# Patient Record
Sex: Female | Born: 1935 | Race: White | Hispanic: No | Marital: Married | Smoking: Never smoker
Health system: Southern US, Community
[De-identification: ages and names within clinical notes are randomized; demographics above are authoritative.]

## PROBLEM LIST (undated history)

## (undated) DIAGNOSIS — I10 Essential (primary) hypertension: Secondary | ICD-10-CM

## (undated) DIAGNOSIS — I429 Cardiomyopathy, unspecified: Secondary | ICD-10-CM

## (undated) DIAGNOSIS — M1990 Unspecified osteoarthritis, unspecified site: Secondary | ICD-10-CM

## (undated) DIAGNOSIS — I447 Left bundle-branch block, unspecified: Secondary | ICD-10-CM

## (undated) DIAGNOSIS — E538 Deficiency of other specified B group vitamins: Secondary | ICD-10-CM

## (undated) DIAGNOSIS — I509 Heart failure, unspecified: Secondary | ICD-10-CM

## (undated) HISTORY — PX: DILATION AND CURETTAGE OF UTERUS: SHX78

---

## 2010-05-31 ENCOUNTER — Ambulatory Visit: Payer: Self-pay | Admitting: Internal Medicine

## 2011-07-24 ENCOUNTER — Ambulatory Visit: Payer: Self-pay | Admitting: Internal Medicine

## 2012-07-27 ENCOUNTER — Ambulatory Visit: Payer: Self-pay

## 2013-12-07 ENCOUNTER — Ambulatory Visit: Payer: Self-pay

## 2016-04-08 ENCOUNTER — Ambulatory Visit
Admission: EM | Admit: 2016-04-08 | Discharge: 2016-04-08 | Disposition: A | Discharge status: Home / Self Care | Payer: Medicare Other | Attending: Family Medicine | Admitting: Family Medicine

## 2016-04-08 DIAGNOSIS — S39012A Strain of muscle, fascia and tendon of lower back, initial encounter: Secondary | ICD-10-CM | POA: Diagnosis not present

## 2016-04-08 HISTORY — DX: Deficiency of other specified B group vitamins: E53.8

## 2016-04-08 HISTORY — DX: Cardiomyopathy, unspecified: I42.9

## 2016-04-08 HISTORY — DX: Left bundle-branch block, unspecified: I44.7

## 2016-04-08 HISTORY — DX: Unspecified osteoarthritis, unspecified site: M19.90

## 2016-04-08 MED ORDER — HYDROCODONE-ACETAMINOPHEN 5-325 MG PO TABS: ORAL_TABLET | ORAL | 0 refills | Status: DC

## 2016-04-08 NOTE — ED Provider Notes (Signed)
[facility]    CSN: [id number] Arrival date & time: 04/08/16  1434  First Provider Contact:  None       History   Chief Complaint Chief Complaint  Patient presents with  . Back Pain    HPI Krista Phillips is a 82 y.o. female.   The history is provided by the patient.  Patient states that she was having an Echo Cardiogram on 03/20/2016. Patient states that she had to get on to a hard table and she had noticed some back then. Patient states that pain is in lower back and radiates to abdomen. Patient describes pain as worse when she has to pull up. Patient states that she has noticed that pain does improve some with tylenol. Denies chest pain, shortness of breath, dysuria, hematuria, bowel/bladder problems, numbness/tingling.   Past Medical History:  Diagnosis Date  . B12 deficiency   . Cardiomyopathy ([place])   . Degenerative arthritis   . Left bundle branch block (LBBB)     There are no active problems to display for this patient.   Past Surgical History:  Procedure Laterality Date  . DILATION AND CURETTAGE OF UTERUS      OB History    No data available       Home Medications    Prior to Admission medications   Medication Sig Start Date End Date Taking? Authorizing Provider  carvedilol (COREG) 3.125 MG tablet Take 3.125 mg by mouth 2 (two) times daily with a meal.   Yes Historical Provider, MD  ramipril (ALTACE) 2.5 MG capsule Take 2.5 mg by mouth daily.   Yes Historical Provider, MD  vitamin B-12 (CYANOCOBALAMIN) 1000 MCG tablet Take 1,000 mcg by mouth daily.   Yes Historical Provider, MD  HYDROcodone-acetaminophen (NORCO/VICODIN) 5-325 MG tablet 1 tab po bid prn 04/08/16   Norval Gable, MD    Family History Family History  Problem Relation Age of Onset  . Ovarian cancer Mother   . Ulcers Father   . Peptic Ulcer Father   . Colon cancer Sister     Social History Social History  Substance Use Topics  . Smoking status: Never Smoker  . Smokeless  tobacco: Never Used  . Alcohol use No     Allergies   Review of patient's allergies indicates no known allergies.   Review of Systems Review of Systems   Physical Exam Triage Vital Signs ED Triage Vitals  Enc Vitals Group     BP 04/08/16 1525 134/69     Pulse Rate 04/08/16 1525 75     Resp 04/08/16 1525 16     Temp 04/08/16 1525 97.8 F (36.6 C)     Temp Source 04/08/16 1525 Tympanic     SpO2 04/08/16 1525 100 %     Weight 04/08/16 1525 138 lb (62.6 kg)     Height 04/08/16 1525 5\' 2"  (1.575 m)     Head Circumference --      Peak Flow --      Pain Score 04/08/16 1524 5     Pain Loc --      Pain Edu? --      Excl. in [place]? --    No data found.   Updated Vital Signs BP 134/69 (BP Location: Left Arm)   Pulse 75   Temp 97.8 F (36.6 C) (Tympanic)   Resp 16   Ht 5\' 2"  (1.575 m)   Wt 138 lb (62.6 kg)   SpO2 100%   BMI 25.24 kg/m  Visual Acuity Right Eye Distance:   Left Eye Distance:   Bilateral Distance:    Right Eye Near:   Left Eye Near:    Bilateral Near:     Physical Exam  Constitutional: She appears well-developed and well-nourished. No distress.  Musculoskeletal: She exhibits tenderness. She exhibits no edema.       Lumbar back: She exhibits tenderness and spasm. She exhibits normal range of motion, no bony tenderness, no swelling, no edema, no deformity, no laceration, no pain and normal pulse.  Neurological: She is alert. She has normal reflexes. She displays normal reflexes. She exhibits normal muscle tone.  Skin: Skin is warm and dry. No rash noted. She is not diaphoretic. No erythema.  Nursing note and vitals reviewed.    UC Treatments / Results  Labs (all labs ordered are listed, but only abnormal results are displayed) Labs Reviewed - No data to display  EKG  EKG Interpretation None       Radiology No results found.  Procedures Procedures (including critical care time)  Medications Ordered in UC Medications - No data to  display   Initial Impression / Assessment and Plan / UC Course  I have reviewed the triage vital signs and the nursing notes.  Pertinent labs & imaging results that were available during my care of the patient were reviewed by me and considered in my medical decision making (see chart for details).  Clinical Course      Final Clinical Impressions(s) / UC Diagnoses   Final diagnoses:  Lumbar strain, initial encounter    New Prescriptions Discharge Medication List as of 04/08/2016  3:49 PM    START taking these medications   Details  HYDROcodone-acetaminophen (NORCO/VICODIN) 5-325 MG tablet 1 tab po bid prn, Print      1. diagnosis reviewed with patient 2. rx as per orders above; reviewed possible side effects, interactions, risks and benefits  3. Recommend supportive treatment with heat, stretching 4. Follow-up prn if symptoms worsen or don't improve   Norval Gable, MD 04/23/16 1201

## 2016-04-08 NOTE — ED Triage Notes (Signed)
Patient states that she was having an Echo Cardiogram on 03/20/2016. Patient states that she had to get on to a hard table and she had noticed some back then. Patient states that pain is in lower back and radiates to abdomen. Patient describes pain as worse when she has to pull up. Patient states that she has noticed that pain does improve some with tylenol.

## 2018-03-27 ENCOUNTER — Emergency Department: Payer: Medicare Other

## 2018-03-27 ENCOUNTER — Emergency Department
Admission: EM | Admit: 2018-03-27 | Discharge: 2018-03-27 | Disposition: A | Discharge status: Home / Self Care | Payer: Medicare Other | Attending: Emergency Medicine | Admitting: Emergency Medicine

## 2018-03-27 ENCOUNTER — Other Ambulatory Visit: Payer: Self-pay

## 2018-03-27 DIAGNOSIS — Z79899 Other long term (current) drug therapy: Secondary | ICD-10-CM | POA: Insufficient documentation

## 2018-03-27 DIAGNOSIS — Y92122 Bedroom in nursing home as the place of occurrence of the external cause: Secondary | ICD-10-CM | POA: Diagnosis not present

## 2018-03-27 DIAGNOSIS — S0990XA Unspecified injury of head, initial encounter: Secondary | ICD-10-CM | POA: Diagnosis present

## 2018-03-27 DIAGNOSIS — S0101XA Laceration without foreign body of scalp, initial encounter: Secondary | ICD-10-CM | POA: Diagnosis not present

## 2018-03-27 DIAGNOSIS — Z23 Encounter for immunization: Secondary | ICD-10-CM | POA: Insufficient documentation

## 2018-03-27 DIAGNOSIS — D329 Benign neoplasm of meninges, unspecified: Secondary | ICD-10-CM

## 2018-03-27 DIAGNOSIS — Y998 Other external cause status: Secondary | ICD-10-CM | POA: Insufficient documentation

## 2018-03-27 DIAGNOSIS — W06XXXA Fall from bed, initial encounter: Secondary | ICD-10-CM | POA: Diagnosis not present

## 2018-03-27 DIAGNOSIS — D320 Benign neoplasm of cerebral meninges: Secondary | ICD-10-CM | POA: Diagnosis not present

## 2018-03-27 DIAGNOSIS — W19XXXA Unspecified fall, initial encounter: Secondary | ICD-10-CM

## 2018-03-27 DIAGNOSIS — Y9389 Activity, other specified: Secondary | ICD-10-CM | POA: Insufficient documentation

## 2018-03-27 MED ORDER — LIDOCAINE HCL (PF) 1 % IJ SOLN
5.0000 mL | Freq: Once | INTRAMUSCULAR | Status: AC
  Administered 2018-03-27: 5 mL via INTRADERMAL
  Filled 2018-03-27: qty 5

## 2018-03-27 MED ORDER — TETANUS-DIPHTH-ACELL PERTUSSIS 5-2.5-18.5 LF-MCG/0.5 IM SUSP
0.5000 mL | Freq: Once | INTRAMUSCULAR | Status: AC
  Administered 2018-03-27: 0.5 mL via INTRAMUSCULAR
  Filled 2018-03-27: qty 0.5

## 2018-03-27 NOTE — ED Notes (Addendum)
.   Pt is resting, Respirations even and unlabored, NAD. Stretcher lowest postion and locked. Call bell within reach. Denies any needs at this time RN will continue to monitor.  Pt family at bedside awaiting transport back to [facility]. Family is requesting a ETA on transport. RN will inquire.

## 2018-03-27 NOTE — ED Provider Notes (Signed)
[facility] Emergency Department Provider Note  ____________________________________________   First MD Initiated Contact with Patient 03/27/18 0501     (approximate)  I have reviewed the triage vital signs and the nursing notes.   HISTORY  Chief Complaint Fall and Head Laceration   HPI Krista Phillips is a 84 y.o. female is brought to the emergency department via EMS after an unwitnessed fall at her nursing home.  She says she remembers sitting on the edge of her bed and falling forward.  She denies chest pain or palpitations.  Her last tetanus is unknown.  She does not take blood thinning medication.  She had sudden onset minor headache that is resolved with time.  She has no complaints at this point.  She denies headache chest pain shortness of breath abdominal pain nausea vomiting double vision blurred vision.  She was able to get up and ambulate.    Past Medical History:  Diagnosis Date  . B12 deficiency   . Cardiomyopathy ([place])   . Degenerative arthritis   . Left bundle branch block (LBBB)     There are no active problems to display for this patient.   Past Surgical History:  Procedure Laterality Date  . DILATION AND CURETTAGE OF UTERUS      Prior to Admission medications   Medication Sig Start Date End Date Taking? Authorizing Provider  carvedilol (COREG) 3.125 MG tablet Take 3.125 mg by mouth 2 (two) times daily with a meal.    [provider]  HYDROcodone-acetaminophen (NORCO/VICODIN) 5-325 MG tablet 1 tab po bid prn 04/08/16   Norval Gable, MD  ramipril (ALTACE) 2.5 MG capsule Take 2.5 mg by mouth daily.    [provider]  vitamin B-12 (CYANOCOBALAMIN) 1000 MCG tablet Take 1,000 mcg by mouth daily.    [provider]    Allergies Patient has no known allergies.  Family History  Problem Relation Age of Onset  . Ovarian cancer Mother   . Ulcers Father   . Peptic Ulcer Father   . Colon cancer Sister      Social History Social History   Tobacco Use  . Smoking status: Never Smoker  . Smokeless tobacco: Never Used  Substance Use Topics  . Alcohol use: No  . Drug use: No    Review of Systems Constitutional: No fever/chills Eyes: No visual changes. ENT: No sore throat. Cardiovascular: Denies chest pain. Respiratory: Denies shortness of breath. Gastrointestinal: No abdominal pain.  No nausea, no vomiting.  No diarrhea.  No constipation. Genitourinary: Negative for dysuria. Musculoskeletal: Negative for back pain. Skin: Positive for wound Neurological: Positive headache   ____________________________________________   PHYSICAL EXAM:  VITAL SIGNS: ED Triage Vitals  Enc Vitals Group     BP      Pulse      Resp      Temp      Temp src      SpO2      Weight      Height      Head Circumference      Peak Flow      Pain Score      Pain Loc      Pain Edu?      Excl. in [place]?     Constitutional: Pleasant cooperative Eyes: PERRL EOMI. midrange and brisk Head: 3 cm laceration to vertex of scalp. Nose: No congestion/rhinnorhea. Mouth/Throat: No trismus Neck: No stridor.   Cardiovascular: Normal rate, regular rhythm. Grossly normal heart sounds.  Good peripheral circulation. Respiratory: Normal respiratory effort.  No retractions. Lungs CTAB and moving good air Gastrointestinal: Soft nontender Musculoskeletal: No lower extremity edema   Neurologic:   No gross focal neurologic deficits are appreciated. Skin:  Skin is warm, dry and intact. No rash noted. Psychiatric: Somewhat demented but calm and cooperative    ____________________________________________   DIFFERENTIAL includes but not limited to  Intracerebral hemorrhage, cervical spine fracture, syncope, mechanical fall, palpitations, laceration ____________________________________________   LABS (all labs ordered are listed, but only abnormal results are displayed)  Labs Reviewed - No data to  display   __________________________________________  EKG   ____________________________________________  RADIOLOGY  CT scan of the head reviewed by me with no acute disease ____________________________________________   PROCEDURES  Procedure(s) performed: Yes  .Marland KitchenLaceration Repair Date/Time: 03/27/2018 6:13 AM Performed by: Darel Hong, MD Authorized by: Darel Hong, MD   Consent:    Consent obtained:  Verbal   Consent given by:  Patient and healthcare agent   Risks discussed:  Infection, pain, retained foreign body, poor cosmetic result and poor wound healing Anesthesia (see MAR for exact dosages):    Anesthesia method:  Local infiltration   Local anesthetic:  Lidocaine 1% w/o epi Laceration details:    Location:  Scalp   Scalp location:  Crown   Length (cm):  3 Repair type:    Repair type:  Simple Pre-procedure details:    Preparation:  Patient was prepped and draped in usual sterile fashion and imaging obtained to evaluate for foreign bodies Exploration:    Hemostasis achieved with:  Direct pressure   Wound exploration: entire depth of wound probed and visualized     Contaminated: no   Treatment:    Area cleansed with:  Saline   Amount of cleaning:  Extensive   Irrigation solution:  Sterile saline   Visualized foreign bodies/material removed: no   Skin repair:    Repair method:  Sutures and staples   Number of sutures:  3 Approximation:    Approximation:  Close Post-procedure details:    Dressing:  Sterile dressing   Patient tolerance of procedure:  Tolerated well, no immediate complications    Critical Care performed: no  ____________________________________________   INITIAL IMPRESSION / ASSESSMENT AND PLAN / ED COURSE  Pertinent labs & imaging results that were available during my care of the patient were reviewed by me and considered in my medical decision making (see chart for details).   The patient arrives with obvious trauma to  the vertex of her scalp.  Takes no blood thinning medication.  She is neuro intact and well she clearly has dementia is behaving appropriately.  Hemodynamically stable.  Tetanus will be updated and CT scan of the head pending.  ----------------------------------------- 6:15 AM on 03/27/2018 -----------------------------------------  The patient CT scan is fortunately reassuring.  She does have a meningioma and I disclosed this to the patient and family at bedside.  I anesthetized her wound with 1% lidocaine without epinephrine and then closed it with 3 staples with adequate cosmesis.  Strict return precautions have been given and the patient is discharged back home in improved condition.      ____________________________________________   FINAL CLINICAL IMPRESSION(S) / ED DIAGNOSES  Final diagnoses:  Laceration of scalp without foreign body, initial encounter  Fall, initial encounter  Meningioma ([place])      NEW MEDICATIONS STARTED DURING THIS VISIT:  New Prescriptions   No medications on file     Note:  This document was  prepared using Systems analyst and may include unintentional dictation errors.     Darel Hong, MD 03/27/18 [phone]

## 2018-03-27 NOTE — ED Triage Notes (Signed)
Pt arrives to ED via [facility] from [facility] s/p unwitnessed fall. Pt is from the Memory Care unit and unable to elaborate on details leading up to the fall. Pt denies any c/o pain at this time. Pt has an approximately 8-10 cm laceration on the top of the scalp with minimal bleeding at this time.

## 2018-03-27 NOTE — ED Notes (Signed)
Secretary notified RN that the truck is on the way. RN notified family and they agree to wait on the EMS.

## 2018-03-27 NOTE — Discharge Instructions (Signed)
Today I placed a total of 3 staples on the top of your head which need to come out in 10 to 14 days.  Any doctor can do this.  Please follow-up with your primary care physician this coming week for recheck and return to the emergency department sooner for any concerns whatsoever.  It was a pleasure to take care of you today, and thank you for coming to our emergency department.  If you have any questions or concerns before leaving please ask the nurse to grab me and I'm more than happy to go through your aftercare instructions again.  If you were prescribed any opioid pain medication today such as Norco, Vicodin, Percocet, morphine, hydrocodone, or oxycodone please make sure you do not drive when you are taking this medication as it can alter your ability to drive safely.  If you have any concerns once you are home that you are not improving or are in fact getting worse before you can make it to your follow-up appointment, please do not hesitate to call [phone] and come back for further evaluation.  Darel Hong, MD  No results found for this or any previous visit. Ct Head Wo Contrast  Result Date: 03/27/2018 CLINICAL DATA:  84 year old female with head trauma. EXAM: CT HEAD WITHOUT CONTRAST TECHNIQUE: Contiguous axial images were obtained from the base of the skull through the vertex without intravenous contrast. COMPARISON:  None. FINDINGS: Brain: Mild to moderate age-related atrophy and chronic microvascular ischemic changes. There is no acute intracranial hemorrhage. No mass effect or midline shift. No extra-axial fluid collection. There is a 5 mm dural-based lesion in the right frontal extra-axial space along the inner table of the calvarium most consistent with a meningioma. Vascular: No hyperdense vessel or unexpected calcification. Skull: Normal. Negative for fracture or focal lesion. Sinuses/Orbits: No acute finding. Other: None IMPRESSION: 1. No acute intracranial hemorrhage. 2. Age-related  atrophy and chronic microvascular ischemic changes. 3. A 5 mm right frontal meningioma. Electronically Signed   By: Anner Crete M.D.   On: 03/27/2018 06:04

## 2018-03-27 NOTE — ED Notes (Signed)
Patient transported to CT 

## 2018-03-27 NOTE — ED Notes (Signed)
Pt family is requesting to take her back to [facility] on their own. This RN will notify secretory Lissa Morales to cancel EMS.

## 2018-03-27 NOTE — ED Notes (Signed)
Pt family was notified that it would be sometime after 9 AM. Pt family agreeable to wait. RN will monitor.

## 2018-06-28 ENCOUNTER — Emergency Department: Payer: Medicare Other

## 2018-06-28 ENCOUNTER — Other Ambulatory Visit: Payer: Self-pay

## 2018-06-28 ENCOUNTER — Encounter: Payer: Self-pay | Admitting: *Deleted

## 2018-06-28 ENCOUNTER — Inpatient Hospital Stay
Admission: EM | Admit: 2018-06-28 | Discharge: 2018-06-30 | DRG: 871 | Disposition: A | Discharge status: Home Health | Payer: Medicare Other | Attending: Internal Medicine | Admitting: Internal Medicine

## 2018-06-28 DIAGNOSIS — R05 Cough: Secondary | ICD-10-CM

## 2018-06-28 DIAGNOSIS — Z66 Do not resuscitate: Secondary | ICD-10-CM | POA: Diagnosis present

## 2018-06-28 DIAGNOSIS — J208 Acute bronchitis due to other specified organisms: Secondary | ICD-10-CM | POA: Diagnosis present

## 2018-06-28 DIAGNOSIS — A419 Sepsis, unspecified organism: Secondary | ICD-10-CM | POA: Diagnosis present

## 2018-06-28 DIAGNOSIS — A4189 Other specified sepsis: Secondary | ICD-10-CM | POA: Diagnosis present

## 2018-06-28 DIAGNOSIS — I447 Left bundle-branch block, unspecified: Secondary | ICD-10-CM | POA: Diagnosis present

## 2018-06-28 DIAGNOSIS — M4854XA Collapsed vertebra, not elsewhere classified, thoracic region, initial encounter for fracture: Secondary | ICD-10-CM | POA: Diagnosis present

## 2018-06-28 DIAGNOSIS — R6510 Systemic inflammatory response syndrome (SIRS) of non-infectious origin without acute organ dysfunction: Secondary | ICD-10-CM

## 2018-06-28 DIAGNOSIS — I509 Heart failure, unspecified: Secondary | ICD-10-CM | POA: Diagnosis present

## 2018-06-28 DIAGNOSIS — E860 Dehydration: Secondary | ICD-10-CM | POA: Diagnosis present

## 2018-06-28 DIAGNOSIS — R509 Fever, unspecified: Secondary | ICD-10-CM

## 2018-06-28 DIAGNOSIS — F0390 Unspecified dementia without behavioral disturbance: Secondary | ICD-10-CM | POA: Diagnosis present

## 2018-06-28 DIAGNOSIS — I110 Hypertensive heart disease with heart failure: Secondary | ICD-10-CM | POA: Diagnosis present

## 2018-06-28 DIAGNOSIS — G9341 Metabolic encephalopathy: Secondary | ICD-10-CM | POA: Diagnosis present

## 2018-06-28 DIAGNOSIS — E538 Deficiency of other specified B group vitamins: Secondary | ICD-10-CM | POA: Diagnosis present

## 2018-06-28 DIAGNOSIS — R059 Cough, unspecified: Secondary | ICD-10-CM

## 2018-06-28 DIAGNOSIS — I428 Other cardiomyopathies: Secondary | ICD-10-CM | POA: Diagnosis present

## 2018-06-28 DIAGNOSIS — Z79899 Other long term (current) drug therapy: Secondary | ICD-10-CM | POA: Diagnosis not present

## 2018-06-28 HISTORY — DX: Heart failure, unspecified: I50.9

## 2018-06-28 HISTORY — DX: Essential (primary) hypertension: I10

## 2018-06-28 LAB — CBC WITH DIFFERENTIAL/PLATELET
ABS IMMATURE GRANULOCYTES: 0.03 10*3/uL (ref 0.00–0.07)
Basophils Absolute: 0.1 10*3/uL (ref 0.0–0.1)
Basophils Relative: 1 %
EOS ABS: 0 10*3/uL (ref 0.0–0.5)
EOS PCT: 0 %
HCT: 42.7 % (ref 36.0–46.0)
Hemoglobin: 13.6 g/dL (ref 12.0–15.0)
Immature Granulocytes: 0 %
Lymphocytes Relative: 13 %
Lymphs Abs: 1.1 10*3/uL (ref 0.7–4.0)
MCH: 29.4 pg (ref 26.0–34.0)
MCHC: 31.9 g/dL (ref 30.0–36.0)
MCV: 92.4 fL (ref 80.0–100.0)
MONO ABS: 0.7 10*3/uL (ref 0.1–1.0)
MONOS PCT: 8 %
NEUTROS ABS: 6.2 10*3/uL (ref 1.7–7.7)
Neutrophils Relative %: 78 %
Platelets: 231 10*3/uL (ref 150–400)
RBC: 4.62 MIL/uL (ref 3.87–5.11)
RDW: 14.6 % (ref 11.5–15.5)
WBC: 8 10*3/uL (ref 4.0–10.5)
nRBC: 0 % (ref 0.0–0.2)

## 2018-06-28 LAB — TROPONIN I

## 2018-06-28 LAB — COMPREHENSIVE METABOLIC PANEL
ALT: 12 U/L (ref 0–44)
ANION GAP: 11 (ref 5–15)
AST: 28 U/L (ref 15–41)
Albumin: 3.3 g/dL — ABNORMAL LOW (ref 3.5–5.0)
Alkaline Phosphatase: 137 U/L — ABNORMAL HIGH (ref 38–126)
BUN: 19 mg/dL (ref 8–23)
CHLORIDE: 100 mmol/L (ref 98–111)
CO2: 24 mmol/L (ref 22–32)
CREATININE: 1.08 mg/dL — AB (ref 0.44–1.00)
Calcium: 8.7 mg/dL — ABNORMAL LOW (ref 8.9–10.3)
GFR calc Af Amer: 51 mL/min — ABNORMAL LOW (ref >60)
GFR, EST NON AFRICAN AMERICAN: 44 mL/min — AB (ref >60)
Glucose, Bld: 215 mg/dL — ABNORMAL HIGH (ref 70–99)
POTASSIUM: 3.9 mmol/L (ref 3.5–5.1)
SODIUM: 135 mmol/L (ref 135–145)
Total Bilirubin: 0.9 mg/dL (ref 0.3–1.2)
Total Protein: 7.1 g/dL (ref 6.5–8.1)

## 2018-06-28 LAB — URINALYSIS, COMPLETE (UACMP) WITH MICROSCOPIC
BILIRUBIN URINE: NEGATIVE
Bacteria, UA: NONE SEEN
GLUCOSE, UA: 100 mg/dL — AB
HGB URINE DIPSTICK: NEGATIVE
Ketones, ur: NEGATIVE mg/dL
Leukocytes, UA: NEGATIVE
NITRITE: NEGATIVE
PROTEIN: NEGATIVE mg/dL
Specific Gravity, Urine: 1.015 (ref 1.005–1.030)
pH: 7 (ref 5.0–8.0)

## 2018-06-28 LAB — LACTIC ACID, PLASMA
Lactic Acid, Venous: 3.3 mmol/L (ref 0.5–1.9)
Lactic Acid, Venous: 1.8 mmol/L (ref 0.5–1.9)

## 2018-06-28 LAB — INFLUENZA PANEL BY PCR (TYPE A & B)
Influenza A By PCR: NEGATIVE
Influenza B By PCR: NEGATIVE

## 2018-06-28 MED ORDER — SODIUM CHLORIDE 0.9 % IV SOLN
2.0000 g | Freq: Once | INTRAVENOUS | Status: AC
  Administered 2018-06-28: 2 g via INTRAVENOUS
  Filled 2018-06-28: qty 2

## 2018-06-28 MED ORDER — VANCOMYCIN HCL IN DEXTROSE 1-5 GM/200ML-% IV SOLN
1000.0000 mg | Freq: Once | INTRAVENOUS | Status: AC
  Administered 2018-06-28: 1000 mg via INTRAVENOUS
  Filled 2018-06-28: qty 200

## 2018-06-28 MED ORDER — VANCOMYCIN HCL IN DEXTROSE 1-5 GM/200ML-% IV SOLN
1000.0000 mg | INTRAVENOUS | Status: DC
  Filled 2018-06-28: qty 200

## 2018-06-28 MED ORDER — METRONIDAZOLE IN NACL 5-0.79 MG/ML-% IV SOLN
500.0000 mg | Freq: Three times a day (TID) | INTRAVENOUS | Status: DC
  Administered 2018-06-28 – 2018-06-29 (×3): 500 mg via INTRAVENOUS
  Filled 2018-06-28 (×5): qty 100

## 2018-06-28 MED ORDER — ENOXAPARIN SODIUM 40 MG/0.4ML ~~LOC~~ SOLN: 40.0000 mg | SUBCUTANEOUS | Status: DC

## 2018-06-28 MED ORDER — ENOXAPARIN SODIUM 30 MG/0.3ML ~~LOC~~ SOLN
30.0000 mg | SUBCUTANEOUS | Status: DC
  Administered 2018-06-28: 30 mg via SUBCUTANEOUS
  Filled 2018-06-28: qty 0.3

## 2018-06-28 MED ORDER — ACETAMINOPHEN 325 MG PO TABS: 650.0000 mg | ORAL_TABLET | Freq: Four times a day (QID) | ORAL | Status: DC | PRN

## 2018-06-28 MED ORDER — DICLOFENAC SODIUM 1 % TD GEL
4.0000 g | Freq: Three times a day (TID) | TRANSDERMAL | Status: DC | PRN
  Filled 2018-06-28: qty 100

## 2018-06-28 MED ORDER — VANCOMYCIN HCL IN DEXTROSE 1-5 GM/200ML-% IV SOLN: 1000.0000 mg | Freq: Once | INTRAVENOUS | Status: DC

## 2018-06-28 MED ORDER — ONDANSETRON HCL 4 MG/2ML IJ SOLN: 4.0000 mg | Freq: Four times a day (QID) | INTRAMUSCULAR | Status: DC | PRN

## 2018-06-28 MED ORDER — ONDANSETRON HCL 4 MG PO TABS: 4.0000 mg | ORAL_TABLET | Freq: Four times a day (QID) | ORAL | Status: DC | PRN

## 2018-06-28 MED ORDER — SODIUM CHLORIDE 0.9 % IV SOLN
INTRAVENOUS | Status: DC
  Administered 2018-06-28 – 2018-06-29 (×2): via INTRAVENOUS

## 2018-06-28 MED ORDER — SODIUM CHLORIDE 0.9 % IV SOLN
Freq: Once | INTRAVENOUS | Status: AC
  Administered 2018-06-28: 17:00:00 via INTRAVENOUS

## 2018-06-28 MED ORDER — SODIUM CHLORIDE 0.9 % IV SOLN
2.0000 g | INTRAVENOUS | Status: DC
  Filled 2018-06-28: qty 2

## 2018-06-28 MED ORDER — PIPERACILLIN-TAZOBACTAM 3.375 G IVPB 30 MIN
3.3750 g | Freq: Once | INTRAVENOUS | Status: AC
  Administered 2018-06-28: 3.375 g via INTRAVENOUS
  Filled 2018-06-28: qty 50

## 2018-06-28 MED ORDER — ACETAMINOPHEN 650 MG RE SUPP: 650.0000 mg | Freq: Four times a day (QID) | RECTAL | Status: DC | PRN

## 2018-06-28 NOTE — ED Notes (Signed)
Patient taken to imaging. Dr. Jimmye Norman aware of rectal temp.

## 2018-06-28 NOTE — ED Notes (Signed)
Date and time results received: 06/28/18 1613 Test: Lactic Acid Critical Value: 3.3  Name of Provider Notified: Dr. Jimmye Norman  Orders Received? Or Actions Taken?: Orders Received - See Orders for details and Actions Taken: MD aware

## 2018-06-28 NOTE — ED Notes (Signed)
Report given to Kelly RN 

## 2018-06-28 NOTE — Progress Notes (Signed)
Family Meeting Note  Advance Directive:yes  Today a meeting took place with the Patient, patient sister and niece  Patient is unable to participate due [id number] capacity Delirious with underlying dementia   The following clinical team members were present during this meeting:MD  The following were discussed:Patient's diagnosis: Acute metabolic encephalopathy, sepsis with unclear etiology, treatment plan of care, other comorbidities dementia, cardiomyopathy, history of left bundle branch block, B12 deficiency, treatment plan of care discussed in detail with the patient's sister and niece at bedside.  They verbalized understanding of the plan   patient's progosis: Unable to determine and Goals for treatment: DNR Patient niece  Lesle Chris is the healthcare power of attorney Additional follow-up to be provided: Hospitalist  Time spent during discussion:17 min   Nicholes Mango, MD

## 2018-06-28 NOTE — H&P (Addendum)
[facility] at [place] NAME: Krista Phillips    MR#:  [id number]  DATE OF BIRTH:  03-30-1927  DATE OF ADMISSION:  06/28/2018  PRIMARY CARE PHYSICIAN: Adin Hector, MD   REQUESTING/REFERRING PHYSICIAN: Dr. Jimmye Norman  CHIEF COMPLAINT:   Generalized weakness and altered mental status HISTORY OF PRESENT ILLNESS:  Krista Phillips  is a 84 y.o. female with a known history of cardiomyopathy, dementia, degenerative arthritis, residing at [facility] was found to be more altered than her baseline and not being herself.  Sent over to the emergency department for altered mental status.  Patient was febrile hypotensive tachycardic with elevated lactic acid level at 3.3.  Blood cultures urine cultures were obtained and patient is started on broad-spectrum IV antibiotics Zosyn and vancomycin and hospitalist team is called to admit the patient.  Patient has received fluid bolus also.  Patient's sister and niece are at bedside during my examination  PAST MEDICAL HISTORY:   Past Medical History:  Diagnosis Date  . B12 deficiency   . Cardiomyopathy ([place])   . CHF (congestive heart failure) ([place])   . Degenerative arthritis   . Hypertension   . Left bundle branch block (LBBB)     PAST SURGICAL HISTOIRY:   Past Surgical History:  Procedure Laterality Date  . DILATION AND CURETTAGE OF UTERUS      SOCIAL HISTORY:   Social History   Tobacco Use  . Smoking status: Never Smoker  . Smokeless tobacco: Never Used  Substance Use Topics  . Alcohol use: No    FAMILY HISTORY:   Family History  Problem Relation Age of Onset  . Ovarian cancer Mother   . Ulcers Father   . Peptic Ulcer Father   . Colon cancer Sister     DRUG ALLERGIES:  No Known Allergies  REVIEW OF SYSTEMS:  Review of system unobtainable as the patient is encephalopathic MEDICATIONS AT HOME:   Prior to Admission medications   Medication Sig Start Date End Date Taking?  Authorizing Provider  acetaminophen (TYLENOL) 500 MG tablet Take 500 mg by mouth every 6 (six) hours as needed for mild pain, moderate pain or fever.   Yes [provider]  carvedilol (COREG) 3.125 MG tablet Take 3.125 mg by mouth 2 (two) times daily with a meal.   Yes [provider]  diclofenac sodium (VOLTAREN) 1 % GEL Apply 4 g topically 3 (three) times daily as needed (arthritis pain).   Yes [provider]  furosemide (LASIX) 40 MG tablet Take 40 mg by mouth daily.   Yes [provider]  nystatin-triamcinolone ointment (MYCOLOG) Apply 1 application topically 2 (two) times daily as needed (rash).    Yes [provider]  potassium chloride (K-DUR) 10 MEQ tablet Take 10 mEq by mouth daily.   Yes [provider]  vitamin B-12 (CYANOCOBALAMIN) 1000 MCG tablet Take 1,000 mcg by mouth daily.   Yes [provider]      VITAL SIGNS:  Blood pressure 98/74, pulse (!) 104, temperature (!) 102.3 F (39.1 C), temperature source Rectal, resp. rate 17, height '5\' 2"'  (1.575 m), weight 54.3 kg, SpO2 96 %.  PHYSICAL EXAMINATION:  GENERAL:  84 y.o.-year-old patient lying in the bed with no acute distress.  EYES: Pupils equal, round, reactive to light and accommodation. No scleral icterus. Extraocular muscles intact.  HEENT: Head atraumatic, normocephalic. Oropharynx and nasopharynx clear.  NECK:  Supple, no jugular venous distention. No thyroid  enlargement, no tenderness.  LUNGS: Normal breath sounds bilaterally, no wheezing, rales,rhonchi or crepitation. No use of accessory muscles of respiration.  CARDIOVASCULAR: S1, S2 normal. No murmurs, rubs, or gallops.  ABDOMEN: Soft, nontender, nondistended. Bowel sounds present.  EXTREMITIES: No pedal edema, cyanosis, or clubbing.  NEUROLOGIC: Lethargic but arousable not answering any questions. Gait not checked.  PSYCHIATRIC: The patient is arousable but disoriented SKIN: No obvious rash, lesion, or  ulcer.   LABORATORY PANEL:   CBC Recent Labs  Lab 06/28/18 1519  WBC 8.0  HGB 13.6  HCT 42.7  PLT 231   ------------------------------------------------------------------------------------------------------------------  Chemistries  Recent Labs  Lab 06/28/18 1519  NA 135  K 3.9  CL 100  CO2 24  GLUCOSE 215*  BUN 19  CREATININE 1.08*  CALCIUM 8.7*  AST 28  ALT 12  ALKPHOS 137*  BILITOT 0.9   ------------------------------------------------------------------------------------------------------------------  Cardiac Enzymes Recent Labs  Lab 06/28/18 1519  TROPONINI <0.03   ------------------------------------------------------------------------------------------------------------------  RADIOLOGY:  Dg Chest 2 View  Result Date: 06/28/2018 CLINICAL DATA:  Altered mental status. EXAM: CHEST - 2 VIEW COMPARISON:  None. FINDINGS: Heart size is normal. No edema or effusion to suggest failure. Left hemidiaphragm is elevated. No focal airspace disease is present. Aortic atherosclerosis is noted. Exaggerated thoracic kyphosis is present. Lower thoracic compression fractures are likely remote. IMPRESSION: 1. No acute cardiopulmonary disease. 2. Lower thoracic compression fractures with exaggerated kyphosis. 3. Aortic atherosclerosis. Electronically Signed   By: San Morelle M.D.   On: 06/28/2018 15:31   Ct Head Wo Contrast  Result Date: 06/28/2018 CLINICAL DATA:  Weakness. EXAM: CT HEAD WITHOUT CONTRAST TECHNIQUE: Contiguous axial images were obtained from the base of the skull through the vertex without intravenous contrast. COMPARISON:  Head CT 03/27/2018. FINDINGS: Brain: No evidence of acute infarction, hemorrhage, hydrocephalus, extra-axial collection or mass effect. Atrophy and chronic microvascular ischemic change are noted. Tiny meningioma over the right frontal convexities also noted. Vascular: No hyperdense vessel or unexpected calcification. Skull: No fracture  or focal lesion. Sinuses/Orbits: Scattered ethmoid air cell disease noted. Other: None. IMPRESSION: No acute abnormality. Atrophy and chronic microvascular ischemic change. Electronically Signed   By: Inge Rise M.D.   On: 06/28/2018 15:28    EKG:   Orders placed or performed during the hospital encounter of 06/28/18  . EKG 12-Lead  . EKG 12-Lead  . ED EKG  . ED EKG    IMPRESSION AND PLAN:     #Acute metabolic encephalopathy secondary to sepsis Admit to MedSurg unit Monitor patient closely and make her n.p.o. Neurochecks Pancultures and broad-spectrum IV antibiotics CT head negative Aspiration precautions  #Sepsis unclear etiology Patient met septic criteria at the time of admission with hypotension, tachycardia and elevated lactic acid with fever Blood cultures and urine cultures were obtained Urinalysis with no acute findings Chest x-ray no acute findings Lactic acid is elevated Broad-spectrum IV antibiotics cefepime, Flagyl and Vanco will be continued Aggressive hydration with IV fluids and monitor lactic acid and procalcitonin levels Respiratory virus panel and flu test ordered  #Chronic history of dementia Resume home medications once patient is more awake and alert  #B12 deficiency Currently patient is n.p.o. as the patient is altered  #Lower thoracic compression fracture Pain management as needed seem to be chronic from the falls  #Chronic history of cardiomyopathy  #Generalized weakness needs PT assessment once patient is more awake and alert  All the records are reviewed and case discussed with ED provider. Management plans discussed with the  patient, family and they are in agreement.  CODE STATUS: DO NOT RESUSCITATE  TOTAL TIME TAKING CARE OF THIS PATIENT: 43 minutes.   Note: This dictation was prepared with Dragon dictation along with smaller phrase technology. Any transcriptional errors that result from this process are unintentional.  Nicholes Mango M.D on 06/28/2018 at 6:37 PM  Between 7am to 6pm - Pager - [phone]  After 6pm go to [email or web address] - password [email or web address] [facility]  [facility]  Office  [phone]  CC: Primary care physician; Adin Hector, MD

## 2018-06-28 NOTE — ED Triage Notes (Signed)
Per EMS report, patient is a resident of [facility] and family was concerned that patient appeared weaker today. Patient saw PMD 10 days ago and has an appointment tomorrow to see her primary physician. Patient is in the memory care unit and is alert upon arrival.

## 2018-06-28 NOTE — ED Provider Notes (Signed)
[facility] Emergency Department Provider Note       Time seen: ----------------------------------------- 2:33 PM on 06/28/2018 -----------------------------------------   I have reviewed the triage vital signs and the nursing notes.  HISTORY   Chief Complaint Weakness   HPI Krista Phillips is a 84 y.o. female with a history of cardiomyopathy, left bundle branch block, degenerative arthritis, dementia who presents to the ED for altered mental status.  Patient is a resident of [place] with a reported history of dementia and is DNR however the family arrived and felt like she was not acting herself.  She has a doctor's appointment tomorrow but they felt like she need to be seen today.  She is alert upon arrival here.  Past Medical History:  Diagnosis Date  . B12 deficiency   . Cardiomyopathy ([place])   . Degenerative arthritis   . Left bundle branch block (LBBB)     There are no active problems to display for this patient.   Past Surgical History:  Procedure Laterality Date  . DILATION AND CURETTAGE OF UTERUS      Allergies Patient has no known allergies.  Social History Social History   Tobacco Use  . Smoking status: Never Smoker  . Smokeless tobacco: Never Used  Substance Use Topics  . Alcohol use: No  . Drug use: No   Review of Systems Constitutional: Negative for fever. Cardiovascular: Negative for chest pain. Respiratory: Negative for shortness of breath. Gastrointestinal: Negative for abdominal pain, vomiting and diarrhea. Musculoskeletal: Negative for back pain. Skin: Negative for rash. Neurological: Negative for headaches, focal weakness or numbness.  All systems negative/normal/unremarkable except as stated in the HPI ____________________________________________  PHYSICAL EXAM:  VITAL SIGNS: ED Triage Vitals [06/28/18 1431]  Enc Vitals Group     BP      Pulse      Resp      Temp      Temp src      SpO2      Weight  119 lb 9.6 oz (54.3 kg)     Height 5\' 2"  (1.575 m)     Head Circumference      Peak Flow      Pain Score      Pain Loc      Pain Edu?      Excl. in [place]?    Constitutional: Alert but disoriented, Well appearing and in no distress. Eyes: Conjunctivae are normal. Normal extraocular movements. Cardiovascular: Normal rate, regular rhythm. No murmurs, rubs, or gallops. Respiratory: Normal respiratory effort without tachypnea nor retractions. Breath sounds are clear and equal bilaterally. No wheezes/rales/rhonchi. Gastrointestinal: Soft and nontender. Normal bowel sounds Musculoskeletal: Nontender with normal range of motion in extremities. No lower extremity tenderness nor edema. Neurologic:  Normal speech and language.  Neurolyse weakness, nothing focal Skin:  Skin is warm, dry and intact. No rash noted. Psychiatric: Mood and affect are normal. Speech and behavior are normal.  ____________________________________________  EKG: Interpreted by me.  Sinus tachycardia with a rate of 104 bpm, PVCs, left bundle branch block, long QT  ____________________________________________  ED COURSE:  As part of my medical decision making, I reviewed the following data within the [facility] History obtained from family if available, nursing notes, old chart and ekg, as well as notes from prior ED visits. Patient presented for altered mental status and was found to be febrile on arrival, we will assess with labs and imaging as indicated at this time. Clinical Course as  of Jun 28 1728  Sun Jun 28, 2018  1633 Lactic Acid, Venous(!!): 3.3 [JW]    Clinical Course User Index [JW] Earleen Newport, MD   Procedures ____________________________________________   LABS (pertinent positives/negatives)  Labs Reviewed  COMPREHENSIVE METABOLIC PANEL - Abnormal; Notable for the following components:      Result Value   Glucose, Bld 215 (*)    Creatinine, Ser 1.08 (*)    Calcium 8.7 (*)     Albumin 3.3 (*)    Alkaline Phosphatase 137 (*)    GFR calc non Af Amer 44 (*)    GFR calc Af Amer 51 (*)    All other components within normal limits  URINALYSIS, COMPLETE (UACMP) WITH MICROSCOPIC - Abnormal; Notable for the following components:   Glucose, UA 100 (*)    All other components within normal limits  LACTIC ACID, PLASMA - Abnormal; Notable for the following components:   Lactic Acid, Venous 3.3 (*)    All other components within normal limits  CULTURE, BLOOD (ROUTINE X 2)  CULTURE, BLOOD (ROUTINE X 2)  URINE CULTURE  CBC WITH DIFFERENTIAL/PLATELET  TROPONIN I  CBG MONITORING, ED    RADIOLOGY Images were viewed by me CRITICAL CARE Performed by: Laurence Aly   Total critical care time: 30 minutes  Critical care time was exclusive of separately billable procedures and treating other patients.  Critical care was necessary to treat or prevent imminent or life-threatening deterioration.  Critical care was time spent personally by me on the following activities: development of treatment plan with patient and/or surrogate as well as nursing, discussions with consultants, evaluation of patient's response to treatment, examination of patient, obtaining history from patient or surrogate, ordering and performing treatments and interventions, ordering and review of laboratory studies, ordering and review of radiographic studies, pulse oximetry and re-evaluation of patient's condition.  CT head, chest x-ray IMPRESSION: 1. No acute cardiopulmonary disease. 2. Lower thoracic compression fractures with exaggerated kyphosis. 3. Aortic atherosclerosis. IMPRESSION: No acute abnormality.  Atrophy and chronic microvascular ischemic change. ____________________________________________  DIFFERENTIAL DIAGNOSIS   Dehydration, electrolyte abnormality, occult infection, CVA, MI  FINAL ASSESSMENT AND PLAN  Altered mental status, fever, probable sepsis   Plan: The  patient had presented for altered mental status with a history of dementia. Patient's labs were unremarkable regarding her white blood cell count but her lactic acid level was elevated. Patient's imaging was negative for any acute process.  She has had a bad cough so I presume that she has developing pneumonia and she will be treated as such.  We have given IV fluids as well for presumed sepsis.   Laurence Aly, MD   Note: This note was generated in part or whole with voice recognition software. Voice recognition is usually quite accurate but there are transcription errors that can and very often do occur. I apologize for any typographical errors that were not detected and corrected.     Earleen Newport, MD 06/28/18 [phone]

## 2018-06-28 NOTE — ED Notes (Signed)
Pt taken to floor via stretcher. VSS. NAD family with pt.

## 2018-06-28 NOTE — Progress Notes (Signed)
Pharmacy Antibiotic Note  Mazzy Santarelli is a 84 y.o. female admitted on 06/28/2018 with sepsis.  Pharmacy has been consulted for Vancomycin and Cefepime dosing.  Plan: Vancomycin 1g IV q36h, check a torugh level prior to 5th dose. Watch for changes to renal function that would necessitate recalculating dose. Cefepime 2g IV q24h  Height: 5\' 2"  (157.5 cm) Weight: 119 lb 9.6 oz (54.3 kg) IBW/kg (Calculated) : 50.1  Temp (24hrs), Avg:99.9 F (37.7 C), Min:97.4 F (36.3 C), Max:102.3 F (39.1 C)  Recent Labs  Lab 06/28/18 1519  WBC 8.0  CREATININE 1.08*  LATICACIDVEN 3.3*    Estimated Creatinine Clearance: 27.9 mL/min (A) (by C-G formula based on SCr of 1.08 mg/dL (H)).    No Known Allergies  Antimicrobials this admission: Vancomycin 10/20 >>  Cefepime 10/20 >>  Metronidazole 10/20 >>  Thank you for allowing pharmacy to be a part of this patient's care.  Paulina Fusi, PharmD, BCPS 06/28/2018 7:14 PM

## 2018-06-28 NOTE — Progress Notes (Signed)
ADMISSION NOTE:  Pt admitted to room from 141 from ED. Pt alert to self. Placed on droplet precautions. Sacral dressing applied. Niece [name] [facility]) at the bedside, navigator completed. Bed in lowest position call bell in reach bed alarm on.

## 2018-06-29 LAB — RESPIRATORY PANEL BY PCR
Adenovirus: NOT DETECTED
Bordetella pertussis: NOT DETECTED
CHLAMYDOPHILA PNEUMONIAE-RVPPCR: NOT DETECTED
CORONAVIRUS 229E-RVPPCR: NOT DETECTED
CORONAVIRUS HKU1-RVPPCR: NOT DETECTED
CORONAVIRUS OC43-RVPPCR: NOT DETECTED
Coronavirus NL63: NOT DETECTED
Influenza A: NOT DETECTED
Influenza B: NOT DETECTED
MYCOPLASMA PNEUMONIAE-RVPPCR: NOT DETECTED
Metapneumovirus: NOT DETECTED
PARAINFLUENZA VIRUS 1-RVPPCR: NOT DETECTED
Parainfluenza Virus 2: NOT DETECTED
Parainfluenza Virus 3: NOT DETECTED
Parainfluenza Virus 4: NOT DETECTED
Respiratory Syncytial Virus: DETECTED — AB
Rhinovirus / Enterovirus: NOT DETECTED

## 2018-06-29 LAB — BASIC METABOLIC PANEL
Anion gap: 8 (ref 5–15)
BUN: 19 mg/dL (ref 8–23)
CALCIUM: 8.3 mg/dL — AB (ref 8.9–10.3)
CO2: 27 mmol/L (ref 22–32)
CREATININE: 0.78 mg/dL (ref 0.44–1.00)
Chloride: 106 mmol/L (ref 98–111)
GFR calc Af Amer: >60 mL/min (ref >60)
GLUCOSE: 120 mg/dL — AB (ref 70–99)
POTASSIUM: 3.7 mmol/L (ref 3.5–5.1)
SODIUM: 141 mmol/L (ref 135–145)

## 2018-06-29 LAB — CBC
HCT: 36.8 % (ref 36.0–46.0)
Hemoglobin: 12 g/dL (ref 12.0–15.0)
MCH: 29.7 pg (ref 26.0–34.0)
MCHC: 32.6 g/dL (ref 30.0–36.0)
MCV: 91.1 fL (ref 80.0–100.0)
NRBC: 0 % (ref 0.0–0.2)
Platelets: 203 10*3/uL (ref 150–400)
RBC: 4.04 MIL/uL (ref 3.87–5.11)
RDW: 14.6 % (ref 11.5–15.5)
WBC: 6.5 10*3/uL (ref 4.0–10.5)

## 2018-06-29 LAB — PROCALCITONIN

## 2018-06-29 LAB — PROTIME-INR
INR: 1.03
PROTHROMBIN TIME: 13.4 s (ref 11.4–15.2)

## 2018-06-29 LAB — CORTISOL-AM, BLOOD: CORTISOL - AM: 14.3 ug/dL (ref 6.7–22.6)

## 2018-06-29 LAB — MRSA PCR SCREENING: MRSA by PCR: NEGATIVE

## 2018-06-29 MED ORDER — NYSTATIN-TRIAMCINOLONE 100000-0.1 UNIT/GM-% EX OINT
1.0000 "application " | TOPICAL_OINTMENT | Freq: Two times a day (BID) | CUTANEOUS | Status: DC | PRN
  Filled 2018-06-29: qty 15

## 2018-06-29 MED ORDER — INFLUENZA VAC SPLIT HIGH-DOSE 0.5 ML IM SUSY
0.5000 mL | PREFILLED_SYRINGE | INTRAMUSCULAR | Status: DC
  Filled 2018-06-29: qty 0.5

## 2018-06-29 MED ORDER — ENOXAPARIN SODIUM 40 MG/0.4ML ~~LOC~~ SOLN
40.0000 mg | SUBCUTANEOUS | Status: DC
  Administered 2018-06-29: 40 mg via SUBCUTANEOUS
  Filled 2018-06-29: qty 0.4

## 2018-06-29 MED ORDER — VITAMIN B-12 1000 MCG PO TABS
1000.0000 ug | ORAL_TABLET | Freq: Every day | ORAL | Status: DC
  Administered 2018-06-29: 1000 ug via ORAL
  Filled 2018-06-29: qty 1

## 2018-06-29 NOTE — Progress Notes (Signed)
PHARMACIST - PHYSICIAN COMMUNICATION  CONCERNING:  Enoxaparin (Lovenox) for DVT Prophylaxis    RECOMMENDATION: Patient was prescribed enoxaprin 40mg  q24 hours for VTE prophylaxis.   Filed Weights   06/28/18 1431 06/28/18 2352  Weight: 119 lb 9.6 oz (54.3 kg) 127 lb 13.9 oz (58 kg)    Body mass index is 23.39 kg/m.  Estimated Creatinine Clearance: 37.7 mL/min (by C-G formula based on SCr of 0.78 mg/dL).   Based on [place] patient is candidate for enoxaparin 40mg  every 24 hour based on weight > than 45kg for female and CrCl > 76ml/min  (Patient was originally candidate for enoxaparin 30mg  every 24 hours based on CrCl <37ml/min- 27.9 on 10/20)  DESCRIPTION: Pharmacy has adjusted enoxaparin dose per [place], approved through [place] committee.  Patient is now receiving enoxaparin 40mg  every 24 hours.     Lu Duffel, PharmD Clinical Pharmacist  06/29/2018 9:58 AM

## 2018-06-29 NOTE — Progress Notes (Signed)
[facility] at [place] NAME: Krista Phillips    MR#:  [id number]  DATE OF BIRTH:  31-Jul-1927  SUBJECTIVE:  CHIEF COMPLAINT:   Chief Complaint  Patient presents with  . Weakness   The patient has no complaints.  She is awake, alert, orientated x3.  She has no complaints but has cough and congestion during physical examination. REVIEW OF SYSTEMS:  Review of Systems  Constitutional: Negative for chills, fever and malaise/fatigue.  HENT: Negative for sore throat.   Eyes: Negative for blurred vision and double vision.  Respiratory: Positive for cough. Negative for hemoptysis, shortness of breath, wheezing and stridor.   Cardiovascular: Negative for chest pain, palpitations, orthopnea and leg swelling.  Gastrointestinal: Negative for abdominal pain, blood in stool, diarrhea, melena, nausea and vomiting.  Genitourinary: Negative for dysuria, flank pain and hematuria.  Musculoskeletal: Negative for back pain and joint pain.  Neurological: Negative for dizziness, sensory change, focal weakness, seizures, loss of consciousness, weakness and headaches.  Endo/Heme/Allergies: Negative for polydipsia.  Psychiatric/Behavioral: Negative for depression. The patient is not nervous/anxious.     DRUG ALLERGIES:  No Known Allergies VITALS:  Blood pressure (!) 97/59, pulse 79, temperature 98.1 F (36.7 C), temperature source Oral, resp. rate 15, height 5\' 2"  (1.575 m), weight 58 kg, SpO2 97 %. PHYSICAL EXAMINATION:  Physical Exam  Constitutional: She is oriented to person, place, and time. No distress.  HENT:  Head: Normocephalic.  Mouth/Throat: Oropharynx is clear and moist.  Eyes: Pupils are equal, round, and reactive to light. Conjunctivae and EOM are normal. No scleral icterus.  Neck: Normal range of motion. Neck supple. No JVD present. No tracheal deviation present.  Cardiovascular: Normal rate, regular rhythm and normal heart sounds. Exam reveals no  gallop.  No murmur heard. Pulmonary/Chest: Effort normal and breath sounds normal. No respiratory distress. She has no wheezes. She has no rales.  Abdominal: Soft. Bowel sounds are normal. She exhibits no distension. There is no tenderness. There is no rebound.  Musculoskeletal: Normal range of motion. She exhibits no edema or tenderness.  Neurological: She is alert and oriented to person, place, and time. No cranial nerve deficit.  Skin: No rash noted. No erythema.  Psychiatric:  Mildly demented.   LABORATORY PANEL:  Female CBC Recent Labs  Lab 06/29/18 0414  WBC 6.5  HGB 12.0  HCT 36.8  PLT 203   ------------------------------------------------------------------------------------------------------------------ Chemistries  Recent Labs  Lab 06/28/18 1519 06/29/18 0414  NA 135 141  K 3.9 3.7  CL 100 106  CO2 24 27  GLUCOSE 215* 120*  BUN 19 19  CREATININE 1.08* 0.78  CALCIUM 8.7* 8.3*  AST 28  --   ALT 12  --   ALKPHOS 137*  --   BILITOT 0.9  --    RADIOLOGY:  Dg Chest 2 View  Result Date: 06/28/2018 CLINICAL DATA:  Altered mental status. EXAM: CHEST - 2 VIEW COMPARISON:  None. FINDINGS: Heart size is normal. No edema or effusion to suggest failure. Left hemidiaphragm is elevated. No focal airspace disease is present. Aortic atherosclerosis is noted. Exaggerated thoracic kyphosis is present. Lower thoracic compression fractures are likely remote. IMPRESSION: 1. No acute cardiopulmonary disease. 2. Lower thoracic compression fractures with exaggerated kyphosis. 3. Aortic atherosclerosis. Electronically Signed   By: San Morelle M.D.   On: 06/28/2018 15:31   Ct Head Wo Contrast  Result Date: 06/28/2018 CLINICAL DATA:  Weakness. EXAM: CT HEAD WITHOUT CONTRAST TECHNIQUE: Contiguous axial images  were obtained from the base of the skull through the vertex without intravenous contrast. COMPARISON:  Head CT 03/27/2018. FINDINGS: Brain: No evidence of acute infarction,  hemorrhage, hydrocephalus, extra-axial collection or mass effect. Atrophy and chronic microvascular ischemic change are noted. Tiny meningioma over the right frontal convexities also noted. Vascular: No hyperdense vessel or unexpected calcification. Skull: No fracture or focal lesion. Sinuses/Orbits: Scattered ethmoid air cell disease noted. Other: None. IMPRESSION: No acute abnormality. Atrophy and chronic microvascular ischemic change. Electronically Signed   By: Inge Rise M.D.   On: 06/28/2018 15:28   ASSESSMENT AND PLAN:   #Acute metabolic encephalopathy secondary to sepsis CT head negative Aspiration precautions Improved to her baseline.  #Sepsis, possible due to acute viral bronchitis Low procalcitonin. Respiratory no show syncytial Virus. Follow-up blood cultures. Urinalysis with no acute findings Chest x-ray no acute findings Discontinue broad-spectrum IV antibiotics cefepime, Flagyl and Vanco. Hydrated with IV fluids.  Lactic acidosis.  Improved with above treatment.  #Chronic history of dementia Resumed home medications.  #B12 deficiency Continue vitamin B12 p.o.  #Lower thoracic compression fracture Pain management as needed seem to be chronic from the falls  #Chronic history of CHF and cardiomyopathy.  Unknown type, stable.  Hold Lasix and Coreg due to low blood pressure.  Hypertension.  Hold Coreg and Lasix.  #Generalized weakness. PT.  All the records are reviewed and case discussed with Care Management/Social Worker. Management plans discussed with the patient, her niece and they are in agreement.  CODE STATUS: DNR  TOTAL TIME TAKING CARE OF THIS PATIENT: 35 minutes.   More than 50% of the time was spent in counseling/coordination of care: YES  POSSIBLE D/C IN 2 DAYS, DEPENDING ON CLINICAL CONDITION.   Demetrios Loll M.D on 06/29/2018 at 1:54 PM  Between 7am to 6pm - Pager - [phone]  After 6pm go to [email or web address] - Teacher, early years/pre Hospitalists

## 2018-06-29 NOTE — Clinical Social Work Note (Signed)
Clinical Social Work Assessment  Patient Details  Name: Krista Phillips MRN: [id number] Date of Birth: 11/10/1926  Date of referral:  06/29/18               Reason for consult:  Other (Comment Required)(From [facility] )                Permission sought to share information with:  Chartered certified accountant granted to share information::  Yes, Verbal Permission Granted  Name::      [name]::     Relationship::     Contact Information:     Housing/Transportation Living arrangements for the past 2 months:  [place] of Information:  Facility, Other (Comment Required)(Niece ) Patient Interpreter Needed:  None Criminal Activity/Legal Involvement Pertinent to Current Situation/Hospitalization:  No - Comment as needed Significant Relationships:  Other Family Members(Niece ) Lives with:  Facility Resident Do you feel safe going back to the place where you live?  Yes Need for family participation in patient care:  Yes (Comment)  Care giving concerns:  Patient is a resident at [name] (fax: [phone]).    Social Worker assessment / plan:  Holiday representative (CSW) reviewed chart and noted that patient is from [facility]. CSW contacted [facility] and spoke to med Medical sales representative. Per [name] patient is on the memory care unit and walks with a walker at baseline. Per [name] patient is on room air and was suppose to get her flu shot today at the facility. [name] requested that patient get her flu shot at [facility]. CSW made RN aware of above. CSW made [facility] aware that per MD patient will likely D/C back to ALF tomorrow. Per [name] patient can return when medically stable. CSW attempted to meet with patient however she was pleasantly confused. CSW contacted patient's niece/ HPOA Krista Phillips [phone]. Krista Phillips is agreeable for patient to return to  ALF via EMS. Krista Phillips requested PT at ALF and per [name] they will get [facility] in house PT to work with patient. FL2 complete. CSW will continue to follow and assist as needed.   Employment status:  Retired Forensic scientist:  Medicare PT Recommendations:  Not assessed at this time [place] / Referral to community resources:  Other (Comment Required)(Patient will return to ALF )  Patient/Family's Response to care:  Patient's niece Krista Phillips is agreeable for patient to return to ALF.   Patient/Family's Understanding of and Emotional Response to Diagnosis, Current Treatment, and Prognosis:  Patient's niece Krista Phillips was very pleasant and thanked CSW for assistance.   Emotional Assessment Appearance:  Appears stated age Attitude/Demeanor/Rapport:  Unable to Assess Affect (typically observed):  Unable to Assess Orientation:  Oriented to Self, Fluctuating Orientation (Suspected and/or reported Sundowners), Oriented to Place Alcohol / Substance use:  Not Applicable Psych involvement (Current and /or in the community):  No (Comment)  Discharge Needs  Concerns to be addressed:  Discharge Planning Concerns Readmission within the last 30 days:  No Current discharge risk:  Cognitively Impaired Barriers to Discharge:  Continued Medical Work up   [employer], [name], LCSW 06/29/2018, 5:57 PM

## 2018-06-29 NOTE — NC FL2 (Signed)
  [place] LEVEL OF CARE SCREENING TOOL     IDENTIFICATION  Patient Name: Krista Phillips Birthdate: [birth year] Sex: female Admission Date (Current Location): 06/28/2018  [facility] and [place] Number:  Selena Lesser ([id number] Q) Facility and Address:  [facility], [address] [place], [place] [address]      Provider Number: [id number]  Attending Physician Name and Address:  Demetrios Loll, MD  Relative Name and Phone Number:       Current Level of Care: Hospital Recommended Level of Care: [place], Memory Care Prior Approval Number:    Date Approved/Denied:   PASRR Number:    Discharge Plan: Domiciliary (Rest home)(Memory Care )    Current Diagnoses: Patient Active Problem List   Diagnosis Date Noted  . Sepsis ([place]) 06/28/2018    Orientation RESPIRATION BLADDER Height & Weight     Self, Time, Place  Normal Incontinent Weight: 127 lb 13.9 oz (58 kg) Height:  5\' 2"  (157.5 cm)  BEHAVIORAL SYMPTOMS/MOOD NEUROLOGICAL BOWEL NUTRITION STATUS      Continent Diet(Diet: Heart Healthy )  AMBULATORY STATUS COMMUNICATION OF NEEDS Skin   Limited Assist Verbally Normal                       Personal Care Assistance Level of Assistance  Bathing, Feeding, Dressing Bathing Assistance: Limited assistance Feeding assistance: Independent Dressing Assistance: Limited assistance     Functional Limitations Info  Sight, Hearing, Speech Sight Info: Adequate Hearing Info: Adequate Speech Info: Adequate    SPECIAL CARE FACTORS FREQUENCY  PT (By licensed PT)     PT Frequency: (2-3 home health )              Contractures      Additional Factors Info  Code Status, Allergies Code Status Info: (DNR ) Allergies Info: (No Known Allergies. )           Current Medications (06/29/2018):  This is the current hospital active medication list Current Facility-Administered Medications  Medication Dose Route Frequency Provider  Last Rate Last Dose  . acetaminophen (TYLENOL) tablet 650 mg  650 mg Oral Q6H PRN Gouru, Aruna, MD       Or  . acetaminophen (TYLENOL) suppository 650 mg  650 mg Rectal Q6H PRN Gouru, Aruna, MD      . diclofenac sodium (VOLTAREN) 1 % transdermal gel 4 g  4 g Topical TID PRN Gouru, Aruna, MD      . enoxaparin (LOVENOX) injection 40 mg  40 mg Subcutaneous Q24H Shanlever, Charles M, RPH      . nystatin-triamcinolone ointment (MYCOLOG) 1 application  1 application Topical BID PRN Demetrios Loll, MD      . ondansetron [facility]) tablet 4 mg  4 mg Oral Q6H PRN Gouru, Aruna, MD       Or  . ondansetron (ZOFRAN) injection 4 mg  4 mg Intravenous Q6H PRN Gouru, Aruna, MD      . vitamin B-12 (CYANOCOBALAMIN) tablet 1,000 mcg  1,000 mcg Oral Daily Demetrios Loll, MD         Discharge Medications: Please see discharge summary for a list of discharge medications.  Relevant Imaging Results:  Relevant Lab Results:   Additional Information (SSN: [id number])  Shawndale Kilpatrick, [name], LCSW

## 2018-06-29 NOTE — Progress Notes (Signed)
Infection Prevention  Received call from: Raquel Sarna, RN  Unit:1A Regarding: Resp Viral Panel + RSV; does patient need precautions? IP Recommendation:  Standard precautions for adults +RSV

## 2018-06-29 NOTE — Plan of Care (Signed)

## 2018-06-30 LAB — URINE CULTURE
Culture: NO GROWTH
Special Requests: NORMAL

## 2018-06-30 MED ORDER — GUAIFENESIN 100 MG/5ML PO SOLN: 5.0000 mL | ORAL | 0 refills | Status: AC | PRN

## 2018-06-30 MED ORDER — GUAIFENESIN 100 MG/5ML PO SOLN
5.0000 mL | ORAL | Status: DC | PRN
  Filled 2018-06-30: qty 5

## 2018-06-30 NOTE — Discharge Instructions (Signed)
Aspiration and fall precaution. °

## 2018-06-30 NOTE — Progress Notes (Signed)
PT is recommending home health and patient is medically stable for D/C back to [facility] unit today. Per Sophronia Simas med tech at [facility] they will arrange for in house PT [facility] to see patient and just need a PT order. CSW placed home health orders in D/C packet and put home health PT on the FL2. Patient's sister [name] will transport patient. RN will call report. CSW sent D/C summary and FL2 to [facility]. Patient's niece [name] is aware of above. Please reconsult if future social work needs arise. CSW signing off.   [employer], LCSW [phone]

## 2018-06-30 NOTE — Progress Notes (Signed)
Pt wheeled to car by staff, discharge packet given to niece who was notified to give to facility.

## 2018-06-30 NOTE — NC FL2 (Signed)
  [place] LEVEL OF CARE SCREENING TOOL     IDENTIFICATION  Patient Name: Krista Phillips Birthdate: 1926-11-18 Sex: female Admission Date (Current Location): 06/28/2018  [facility] and [place] Number:  Selena Lesser ([id number] [phone]) Facility and Address:  [facility], [address] [place], [place] [address]      Provider Number: [id number]  Attending Physician Name and Address:  Demetrios Loll, MD  Relative Name and Phone Number:       Current Level of Care: Hospital Recommended Level of Care: [place], Memory Care Prior Approval Number:    Date Approved/Denied:   PASRR Number:    Discharge Plan: Domiciliary (Rest home)(Memory Care )    Current Diagnoses: Patient Active Problem List   Diagnosis Date Noted  . Sepsis ([place]) 06/28/2018    Orientation RESPIRATION BLADDER Height & Weight     Self, Time, Place  Normal Incontinent Weight: 127 lb 13.9 oz (58 kg) Height:  5\' 2"  (157.5 cm)  BEHAVIORAL SYMPTOMS/MOOD NEUROLOGICAL BOWEL NUTRITION STATUS      Continent Diet(Diet: Heart Healthy )  AMBULATORY STATUS COMMUNICATION OF NEEDS Skin   Limited Assist Verbally Normal                       Personal Care Assistance Level of Assistance  Bathing, Feeding, Dressing Bathing Assistance: Limited assistance Feeding assistance: Independent Dressing Assistance: Limited assistance     Functional Limitations Info  Sight, Hearing, Speech Sight Info: Adequate Hearing Info: Adequate Speech Info: Adequate    SPECIAL CARE FACTORS FREQUENCY  PT (By licensed PT)     PT Frequency: (2-3 home health )              Contractures      Additional Factors Info  Code Status, Allergies Code Status Info: (DNR ) Allergies Info: (No Known Allergies. )          Discharge Medications: Please see discharge summary for a list of discharge medications. Medication List    STOP taking these medications   furosemide 40 MG  tablet Commonly known as:  LASIX     TAKE these medications   acetaminophen 500 MG tablet Commonly known as:  TYLENOL Take 500 mg by mouth every 6 (six) hours as needed for mild pain, moderate pain or fever.   carvedilol 3.125 MG tablet Commonly known as:  COREG Take 3.125 mg by mouth 2 (two) times daily with a meal.   diclofenac sodium 1 % Gel Commonly known as:  VOLTAREN Apply 4 g topically 3 (three) times daily as needed (arthritis pain).   guaiFENesin 100 MG/5ML Soln Commonly known as:  ROBITUSSIN Take 5 mLs (100 mg total) by mouth every 4 (four) hours as needed for cough or to loosen phlegm.   nystatin-triamcinolone ointment Commonly known as:  MYCOLOG Apply 1 application topically 2 (two) times daily as needed (rash).   potassium chloride 10 MEQ tablet Commonly known as:  K-DUR Take 10 mEq by mouth daily.   vitamin B-12 1000 MCG tablet Commonly known as:  CYANOCOBALAMIN Take 1,000 mcg by mouth daily.  Relevant Imaging Results: Relevant Lab Results: Additional Information (SSN: [id number])  Mukesh Kornegay, [name], LCSW

## 2018-06-30 NOTE — Evaluation (Signed)
Physical Therapy Evaluation Patient Details Name: Krista Phillips MRN: [id number] DOB: 1927/08/14 Today's Date: 06/30/2018   History of Present Illness  Pt is a 84 y.o female presenting with and increased in altered mental status compared to her baseline as well as general increased weakness. Pt is a long term resident of [facility]. Pt has had a fall most recently in July. PMH significant for cardiomyopathy, left bundle branch block, degenerative arthritis, and dementia.    Clinical Impression  Pt pleasant upon arrival oriented to all but time (typical at baseline according to niece at bedside). Pt and family reporting an increase in weakness and a decline in activity over the most recent weeks. Pt performing bed mobility slow but with no physical assist. Pt typically ambulates to the dining hall at [facility] and currently is able to ambulate 140 feet before getting fatigued and needing to sit. Immediate limp causing step to gait however with further ambulation pt having only mild RLE limp and step through gait. Pt with no reported pain in LE's. Pt is limited in mobility, endurance, and overall strength and would benefit from skilled PT via HHPT at [facility] to improve upon these functional limitations.     Follow Up Recommendations Home health PT    Equipment Recommendations       Recommendations for Other Services       Precautions / Restrictions Precautions Precautions: Fall Restrictions Weight Bearing Restrictions: No      Mobility  Bed Mobility Overal bed mobility: Needs Assistance Bed Mobility: Supine to Sit     Supine to sit: Supervision     General bed mobility comments: Pt with good effort very slow to get into sitting however ultimately needing no physical assist to perform transfer. VC to reach for arm rail of bed to assist in raising trunk into sitting.   Transfers Overall transfer level: Needs assistance Equipment used: Rolling walker (2  wheeled) Transfers: Sit to/from Stand Sit to Stand: Min guard         General transfer comment: CGA for safety, pt rocking 3 times before standing but able to stand without physical assist. Upon standing pt holding RW incorrectly but able to correct after VC. Slight tipping of RW posteriorly upon standing.   Ambulation/Gait Ambulation/Gait assistance: Min guard Gait Distance (Feet): 140 Feet Assistive device: Rolling walker (2 wheeled)       General Gait Details: Pt ambulating slow with moderate RLE limp and step to gait initially however no pain reported. As ambulation progressed out of room pt performing step through with only mild RLE limp, good cadence, age appropriate gait speed, no physical assist or LOB noted.   Stairs            Wheelchair Mobility    Modified Rankin (Stroke Patients Only)       Balance Overall balance assessment: Mild deficits observed, not formally tested                                           Pertinent Vitals/Pain Pain Assessment: No/denies pain    Home Living Family/patient expects to be discharged to:: Skilled nursing facility                 Additional Comments: Long term resident of [facility]    Prior Function Level of Independence: Needs assistance   Gait / Transfers Assistance Needed: Able  to ambulate to dining hall with RW.   ADL's / Homemaking Assistance Needed: Pt needs assistance with bathing, dressing, and toileting.         Hand Dominance        Extremity/Trunk Assessment   Upper Extremity Assessment Upper Extremity Assessment: Generalized weakness(age appropriate deficits noted grossly >3/5)    Lower Extremity Assessment Lower Extremity Assessment: Generalized weakness(age appropriate deficits noted grossly >3/5)       Communication   Communication: No difficulties  Cognition Arousal/Alertness: Awake/alert Behavior During Therapy: WFL for tasks assessed/performed Overall  Cognitive Status: History of cognitive impairments - at baseline                                 General Comments: Niece at bed side reporting pt has cognitive impairements at baseline due to dementia however recently she has been less responsive.       General Comments General comments (skin integrity, edema, etc.): SpO2 in high 90's t/o, HR in high 90's at rest reaching as high at 109 with ambulation    Exercises     Assessment/Plan    PT Assessment Patient needs continued PT services  PT Problem List Decreased strength;Decreased activity tolerance;Decreased balance;Decreased mobility;Decreased safety awareness;Decreased knowledge of use of DME       PT Treatment Interventions DME instruction;Balance training;Modalities;Gait training;Functional mobility training;Patient/family education;Therapeutic activities;Therapeutic exercise    PT Goals (Current goals can be found in the Care Plan section)  Acute Rehab PT Goals Patient Stated Goal: to improve overall strength  PT Goal Formulation: With patient/family Time For Goal Achievement: 07/14/18 Potential to Achieve Goals: Good    Frequency Min 2X/week   Barriers to discharge        Co-evaluation               AM-PAC PT "6 Clicks" Daily Activity  Outcome Measure Difficulty turning over in bed (including adjusting bedclothes, sheets and blankets)?: A Little Difficulty moving from lying on back to sitting on the side of the bed? : A Little Difficulty sitting down on and standing up from a chair with arms (e.g., wheelchair, bedside commode, etc,.)?: A Little Help needed moving to and from a bed to chair (including a wheelchair)?: A Little Help needed walking in hospital room?: A Little Help needed climbing 3-5 steps with a railing? : A Lot 6 Click Score: 17    End of Session Equipment Utilized During Treatment: Gait belt Activity Tolerance: Patient tolerated treatment well Patient left: in chair;with  call bell/phone within reach;with chair alarm set;with family/visitor present Nurse Communication: Mobility status PT Visit Diagnosis: Other abnormalities of gait and mobility (R26.89);Difficulty in walking, not elsewhere classified (R26.2);Muscle weakness (generalized) (M62.81);History of falling (Z91.81)    Time: 9678-9381 PT Time Calculation (min) (ACUTE ONLY): 35 min   Charges:              Ernie Avena, SPT 06/30/2018, 10:23 AM

## 2018-06-30 NOTE — Discharge Summary (Signed)
[facility] NAME: Krista Phillips    MR#:  [id number]  DATE OF BIRTH:  [birth year]  DATE OF ADMISSION:  06/28/2018   ADMITTING PHYSICIAN: Nicholes Mango, MD  DATE OF DISCHARGE: 06/30/2018 PRIMARY CARE PHYSICIAN: Tama High III, MD   ADMISSION DIAGNOSIS:  Cough [R05] SIRS (systemic inflammatory response syndrome) (HCC) [R65.10] Fever, unspecified fever cause [R50.9] DISCHARGE DIAGNOSIS:  Active Problems:   Sepsis ([place])  SECONDARY DIAGNOSIS:   Past Medical History:  Diagnosis Date  . B12 deficiency   . Cardiomyopathy ([place])   . CHF (congestive heart failure) ([place])   . Degenerative arthritis   . Hypertension   . Left bundle branch block (LBBB)    HOSPITAL COURSE:  #Acute metabolic encephalopathy secondary to sepsis CT head negative Aspiration precautions Improved to her baseline.  #Sepsis, possible due to acute viral bronchitis Low procalcitonin. Respiratory show syncytial Virus. Negative blood cultures so far. Urinalysis with no acute findings Chest x-ray no acute findings Discontinued broad-spectrum IV antibiotics cefepime, Flagyl and Vanco. Hydrated with IV fluids.  Lactic acidosis.  Improved with above treatment.  #Chronic history of dementia Resumed home medications.  #B12 deficiency Continue vitamin B12 p.o.  #Lower thoracic compression fracture Pain management as needed seem to be chronic from the falls  #Chronic history of CHF and cardiomyopathy.  Unknown type, stable.  Hold Lasix and Coreg due to low blood pressure.  Hypertension.  resume Coreg and hold Lasix. Follow up PCP to adjust.  #Generalized weakness. HHPT.  DISCHARGE CONDITIONS:  Stable, discharge to ALF with HHPT CONSULTS OBTAINED:   DRUG ALLERGIES:  No Known Allergies DISCHARGE MEDICATIONS:   Allergies as of 06/30/2018   No Known Allergies     Medication List    STOP taking these medications   furosemide 40 MG  tablet Commonly known as:  LASIX     TAKE these medications   acetaminophen 500 MG tablet Commonly known as:  TYLENOL Take 500 mg by mouth every 6 (six) hours as needed for mild pain, moderate pain or fever.   carvedilol 3.125 MG tablet Commonly known as:  COREG Take 3.125 mg by mouth 2 (two) times daily with a meal.   diclofenac sodium 1 % Gel Commonly known as:  VOLTAREN Apply 4 g topically 3 (three) times daily as needed (arthritis pain).   guaiFENesin 100 MG/5ML Soln Commonly known as:  ROBITUSSIN Take 5 mLs (100 mg total) by mouth every 4 (four) hours as needed for cough or to loosen phlegm.   nystatin-triamcinolone ointment Commonly known as:  MYCOLOG Apply 1 application topically 2 (two) times daily as needed (rash).   potassium chloride 10 MEQ tablet Commonly known as:  K-DUR Take 10 mEq by mouth daily.   vitamin B-12 1000 MCG tablet Commonly known as:  CYANOCOBALAMIN Take 1,000 mcg by mouth daily.        DISCHARGE INSTRUCTIONS:  See AVS. If you experience worsening of your admission symptoms, develop shortness of breath, life threatening emergency, suicidal or homicidal thoughts you must seek medical attention immediately by calling 911 or calling your MD immediately  if symptoms less severe.  You Must read complete instructions/literature along with all the possible adverse reactions/side effects for all the Medicines you take and that have been prescribed to you. Take any new Medicines after you have completely understood and accpet all the possible adverse reactions/side effects.   Please note  You were cared for by a hospitalist during  your hospital stay. If you have any questions about your discharge medications or the care you received while you were in the hospital after you are discharged, you can call the unit and asked to speak with the hospitalist on call if the hospitalist that took care of you is not available. Once you are discharged, your primary  care physician will handle any further medical issues. Please note that NO REFILLS for any discharge medications will be authorized once you are discharged, as it is imperative that you return to your primary care physician (or establish a relationship with a primary care physician if you do not have one) for your aftercare needs so that they can reassess your need for medications and monitor your lab values.    On the day of Discharge:  VITAL SIGNS:  Blood pressure 138/65, pulse 73, temperature 97.9 F (36.6 C), temperature source Oral, resp. rate 18, height 5\' 2"  (1.575 m), weight 58 kg, SpO2 97 %. PHYSICAL EXAMINATION:  GENERAL:  84 y.o.-year-old patient lying in the bed with no acute distress.  EYES: Pupils equal, round, reactive to light and accommodation. No scleral icterus. Extraocular muscles intact.  HEENT: Head atraumatic, normocephalic. Oropharynx and nasopharynx clear.  NECK:  Supple, no jugular venous distention. No thyroid enlargement, no tenderness.  LUNGS: Normal breath sounds bilaterally, no wheezing, rales,rhonchi or crepitation. No use of accessory muscles of respiration.  CARDIOVASCULAR: S1, S2 normal. No murmurs, rubs, or gallops.  ABDOMEN: Soft, non-tender, non-distended. Bowel sounds present. No organomegaly or mass.  EXTREMITIES: No pedal edema, cyanosis, or clubbing.  NEUROLOGIC: Cranial nerves II through XII are intact. Muscle strength/ 5 in all extremities. Sensation intact. Gait not checked.  PSYCHIATRIC: The patient is alert and oriented x 3.  SKIN: No obvious rash, lesion, or ulcer.  DATA REVIEW:   CBC Recent Labs  Lab 06/29/18 0414  WBC 6.5  HGB 12.0  HCT 36.8  PLT 203    Chemistries  Recent Labs  Lab 06/28/18 1519 06/29/18 0414  NA 135 141  K 3.9 3.7  CL 100 106  CO2 24 27  GLUCOSE 215* 120*  BUN 19 19  CREATININE 1.08* 0.78  CALCIUM 8.7* 8.3*  AST 28  --   ALT 12  --   ALKPHOS 137*  --   BILITOT 0.9  --      Microbiology Results   Results for orders placed or performed during the hospital encounter of 06/28/18  Blood culture (routine x 2)     Status: None (Preliminary result)   Collection Time: 06/28/18  3:19 PM  Result Value Ref Range Status   Specimen Description BLOOD LEFT ANTECUBITAL  Final   Special Requests   Final    BOTTLES DRAWN AEROBIC AND ANAEROBIC Blood Culture adequate volume   Culture   Final    NO GROWTH 2 DAYS Performed at [facility], [address] [place], [place] [address]    Report Status PENDING  Incomplete  Blood culture (routine x 2)     Status: None (Preliminary result)   Collection Time: 06/28/18  3:19 PM  Result Value Ref Range Status   Specimen Description BLOOD RIGHT ANTECUBITAL  Final   Special Requests   Final    BOTTLES DRAWN AEROBIC AND ANAEROBIC Blood Culture adequate volume   Culture   Final    NO GROWTH 2 DAYS Performed at [facility], [address] [place], [place] [address]    Report Status PENDING  Incomplete  Urine  Culture     Status: None   Collection Time: 06/28/18  4:13 PM  Result Value Ref Range Status   Specimen Description   Final    URINE, CATHETERIZED Performed at [facility], [address], [place], [place] [address]    Special Requests   Final    Normal Performed at [facility], [address] [place], [place] [address]    Culture   Final    NO GROWTH Performed at [facility], [place] [address] [place], [place] [address]    Report Status 06/30/2018 FINAL  Final  Respiratory Panel by PCR     Status: Abnormal   Collection Time: 06/28/18  7:48 PM  Result Value Ref Range Status   Adenovirus NOT DETECTED NOT DETECTED Final   Coronavirus 229E NOT DETECTED NOT DETECTED Final   Coronavirus HKU1 NOT DETECTED NOT DETECTED Final   Coronavirus NL63 NOT DETECTED NOT DETECTED Final   Coronavirus OC43 NOT DETECTED NOT DETECTED Final   Metapneumovirus NOT DETECTED NOT DETECTED Final   Rhinovirus /  Enterovirus NOT DETECTED NOT DETECTED Final   Influenza A NOT DETECTED NOT DETECTED Final   Influenza B NOT DETECTED NOT DETECTED Final   Parainfluenza Virus 1 NOT DETECTED NOT DETECTED Final   Parainfluenza Virus 2 NOT DETECTED NOT DETECTED Final   Parainfluenza Virus 3 NOT DETECTED NOT DETECTED Final   Parainfluenza Virus 4 NOT DETECTED NOT DETECTED Final   Respiratory Syncytial Virus DETECTED (A) NOT DETECTED Final    Comment: CRITICAL RESULT CALLED TO, READ BACK BY AND VERIFIED WITH: Henderson Newcomer RN 10:25 06/29/18 ([email or web address])    Bordetella pertussis NOT DETECTED NOT DETECTED Final   Chlamydophila pneumoniae NOT DETECTED NOT DETECTED Final   Mycoplasma pneumoniae NOT DETECTED NOT DETECTED Final    Comment: Performed at [facility], [place] [address] [place], [place] [address]  MRSA PCR Screening     Status: None   Collection Time: 06/28/18  9:28 PM  Result Value Ref Range Status   MRSA by PCR NEGATIVE NEGATIVE Final    Comment:        The GeneXpert MRSA Assay (FDA approved for NASAL specimens only), is one component of a comprehensive MRSA colonization surveillance program. It is not intended to diagnose MRSA infection nor to guide or monitor treatment for MRSA infections. Performed at [facility], [address] [place], [place] [address]     RADIOLOGY:  No results found.   Management plans discussed with the patient, family and they are in agreement.  CODE STATUS: DNR   TOTAL TIME TAKING CARE OF THIS PATIENT:  32 minutes.    Demetrios Loll M.D on 06/30/2018 at 10:21 AM  Between 7am to 6pm - Pager - [phone]  After 6pm go to [email or web address] - password [email or web address] [facility]  [facility]  Office  [phone]  CC: Primary care physician; Adin Hector, MD   Note: This dictation was prepared with Dragon dictation along with smaller phrase technology. Any transcriptional errors that result from this process are unintentional.

## 2018-06-30 NOTE — Progress Notes (Signed)
Attempted to call report to [place] at [facility], nurse unavailable to take report.

## 2018-07-02 ENCOUNTER — Telehealth: Payer: Self-pay

## 2018-07-02 NOTE — Telephone Encounter (Signed)
EMMI Follow-up: Mrs. Krista Phillips called as she had received an automated call from my number and said she thought it would be regarding her sister, Krista Phillips.  I explained our process of two automated calls post discharge and she said her sister returned to [facility] and was sure the facility was aware of Rx's and follow-up appointments.  I explained these follow-up calls usually go to patients returning home and possibly the wrong discharge code may have been entered and asked if she would like to removed from the follow-up calls and she said no as she didn't mind getting them. No needs noted.

## 2018-07-03 LAB — CULTURE, BLOOD (ROUTINE X 2)
Culture: NO GROWTH
Culture: NO GROWTH
SPECIAL REQUESTS: ADEQUATE
Special Requests: ADEQUATE

## 2018-08-12 ENCOUNTER — Emergency Department: Payer: Medicare Other

## 2018-08-12 ENCOUNTER — Other Ambulatory Visit: Payer: Self-pay

## 2018-08-12 ENCOUNTER — Emergency Department
Admission: EM | Admit: 2018-08-12 | Discharge: 2018-08-12 | Disposition: A | Discharge status: Skilled Nursing Facility | Payer: Medicare Other | Attending: Student in an Organized Health Care Education/Training Program | Admitting: Student in an Organized Health Care Education/Training Program

## 2018-08-12 ENCOUNTER — Encounter: Payer: Self-pay | Admitting: *Deleted

## 2018-08-12 DIAGNOSIS — I110 Hypertensive heart disease with heart failure: Secondary | ICD-10-CM | POA: Insufficient documentation

## 2018-08-12 DIAGNOSIS — W19XXXA Unspecified fall, initial encounter: Secondary | ICD-10-CM

## 2018-08-12 DIAGNOSIS — W182XXA Fall in (into) shower or empty bathtub, initial encounter: Secondary | ICD-10-CM | POA: Diagnosis not present

## 2018-08-12 DIAGNOSIS — R2681 Unsteadiness on feet: Secondary | ICD-10-CM | POA: Insufficient documentation

## 2018-08-12 DIAGNOSIS — Z9181 History of falling: Secondary | ICD-10-CM | POA: Diagnosis not present

## 2018-08-12 DIAGNOSIS — Y939 Activity, unspecified: Secondary | ICD-10-CM | POA: Insufficient documentation

## 2018-08-12 DIAGNOSIS — Y929 Unspecified place or not applicable: Secondary | ICD-10-CM | POA: Insufficient documentation

## 2018-08-12 DIAGNOSIS — S42222A 2-part displaced fracture of surgical neck of left humerus, initial encounter for closed fracture: Secondary | ICD-10-CM

## 2018-08-12 DIAGNOSIS — Y999 Unspecified external cause status: Secondary | ICD-10-CM | POA: Diagnosis not present

## 2018-08-12 DIAGNOSIS — I509 Heart failure, unspecified: Secondary | ICD-10-CM | POA: Diagnosis not present

## 2018-08-12 DIAGNOSIS — S4992XA Unspecified injury of left shoulder and upper arm, initial encounter: Secondary | ICD-10-CM | POA: Diagnosis present

## 2018-08-12 DIAGNOSIS — M6281 Muscle weakness (generalized): Secondary | ICD-10-CM | POA: Diagnosis not present

## 2018-08-12 DIAGNOSIS — Z79899 Other long term (current) drug therapy: Secondary | ICD-10-CM | POA: Diagnosis not present

## 2018-08-12 MED ORDER — TETANUS-DIPHTH-ACELL PERTUSSIS 5-2.5-18.5 LF-MCG/0.5 IM SUSP
0.5000 mL | Freq: Once | INTRAMUSCULAR | Status: DC
  Filled 2018-08-12: qty 0.5

## 2018-08-12 MED ORDER — ACETAMINOPHEN 500 MG PO TABS
1000.0000 mg | ORAL_TABLET | Freq: Once | ORAL | Status: AC
  Administered 2018-08-12: 1000 mg via ORAL
  Filled 2018-08-12: qty 2

## 2018-08-12 MED ORDER — HYDROCODONE-ACETAMINOPHEN 7.5-325 MG/15ML PO SOLN: 5.0000 mL | Freq: Four times a day (QID) | ORAL | 0 refills | Status: DC | PRN

## 2018-08-12 MED ORDER — LIDOCAINE 5 % EX PTCH
1.0000 | MEDICATED_PATCH | CUTANEOUS | Status: DC
  Administered 2018-08-12: 1 via TRANSDERMAL
  Filled 2018-08-12: qty 1

## 2018-08-12 MED ORDER — LIDOCAINE 5 % EX PTCH: 1.0000 | MEDICATED_PATCH | Freq: Two times a day (BID) | CUTANEOUS | 0 refills | Status: DC

## 2018-08-12 NOTE — ED Provider Notes (Signed)
[facility] Emergency Department Provider Note    First MD Initiated Contact with Patient 08/12/18 [phone]     (approximate)  I have reviewed the triage vital signs and the nursing notes.   HISTORY  Chief Complaint Fall    HPI Krista Phillips is a 84 y.o. female presents the ER for left elbow pain after unwitnessed fall that occurred while she was in the bathroom.  Patient states that she lost her footing.  Denies hitting her head.  There is no LOC.  Denies any headache.  Denies any pain.  No hip pain no abdominal pain no chest pain.  No shortness of breath.  Denies any palpitations.  No numbness or tingling.  She arrives with a small superficial skin tear to the left lateral elbow that is currently bandaged.    Past Medical History:  Diagnosis Date  . B12 deficiency   . Cardiomyopathy ([place])   . CHF (congestive heart failure) ([place])   . Degenerative arthritis   . Hypertension   . Left bundle branch block (LBBB)    Family History  Problem Relation Age of Onset  . Ovarian cancer Mother   . Ulcers Father   . Peptic Ulcer Father   . Colon cancer Sister    Past Surgical History:  Procedure Laterality Date  . DILATION AND CURETTAGE OF UTERUS     Patient Active Problem List   Diagnosis Date Noted  . Sepsis ([place]) 06/28/2018      Prior to Admission medications   Medication Sig Start Date End Date Taking? Authorizing Provider  acetaminophen (TYLENOL) 500 MG tablet Take 500 mg by mouth every 6 (six) hours as needed for mild pain, moderate pain or fever.   Yes [provider]  carvedilol (COREG) 3.125 MG tablet Take 3.125 mg by mouth 2 (two) times daily with a meal.   Yes [provider]  diclofenac sodium (VOLTAREN) 1 % GEL Apply 4 g topically 3 (three) times daily as needed (arthritis pain).   Yes [provider]  guaiFENesin (ROBITUSSIN) 100 MG/5ML SOLN Take 5 mLs (100 mg total) by mouth every 4 (four) hours as needed for cough  or to loosen phlegm. 06/30/18  Yes Demetrios Loll, MD  nystatin-triamcinolone ointment [facility]) Apply 1 application topically 2 (two) times daily as needed (rash).    Yes [provider]  vitamin B-12 (CYANOCOBALAMIN) 1000 MCG tablet Take 1,000 mcg by mouth daily.   Yes [provider]  HYDROcodone-acetaminophen (HYCET) 7.5-325 mg/15 ml solution Take 5 mLs by mouth every 6 (six) hours as needed for severe pain. 08/12/18 08/12/19  Merlyn Lot, MD  lidocaine (LIDODERM) 5 % Place 1 patch onto the skin every 12 (twelve) hours. Remove & Discard patch within 12 hours or as directed by MD 08/12/18 08/12/19  Merlyn Lot, MD    Allergies Patient has no known allergies.    Social History Social History   Tobacco Use  . Smoking status: Never Smoker  . Smokeless tobacco: Never Used  Substance Use Topics  . Alcohol use: No  . Drug use: No    Review of Systems Patient denies headaches, rhinorrhea, blurry vision, numbness, shortness of breath, chest pain, edema, cough, abdominal pain, nausea, vomiting, diarrhea, dysuria, fevers, rashes or hallucinations unless otherwise stated above in HPI. ____________________________________________   PHYSICAL EXAM:  VITAL SIGNS: Vitals:   08/12/18 0930 08/12/18 0945  BP:    Pulse: (!) 103 100  Resp:    Temp:  SpO2: 97% 93%    Constitutional: Alert, pleasant and in no acute distress  Eyes: Conjunctivae are normal.  Head: Atraumatic. Nose: No congestion/rhinnorhea. Mouth/Throat: Mucous membranes are moist.   Neck: No stridor. Painless ROM.  Cardiovascular: Normal rate, regular rhythm. Grossly normal heart sounds.  Good peripheral circulation. Respiratory: Normal respiratory effort.  No retractions. Lungs CTAB. Gastrointestinal: Soft and nontender. No distention. No abdominal bruits. No CVA tenderness. Genitourinary:deferred Musculoskeletal:  Ecchymosis and superficial skin tear 2cm to left lateral elbow.  Ecchymosis to  distal forearm without deformity. No lower extremity tenderness nor edema.  No joint effusions. Neurologic:  Normal speech and language. No gross focal neurologic deficits are appreciated. No facial droop Skin:  Skin is warm, dry and intact. No rash noted. Psychiatric: Mood and affect are normal. Speech and behavior are normal.  ____________________________________________   LABS (all labs ordered are listed, but only abnormal results are displayed)  No results found for this or any previous visit (from the past 24 hour(s)). ____________________________________________ ____________________________________________  [id number]  I personally reviewed all radiographic images ordered to evaluate for the above acute complaints and reviewed radiology reports and findings.  These findings were personally discussed with the patient.  Please see medical record for radiology report.  ____________________________________________   PROCEDURES  Procedure(s) performed:  Procedures    Critical Care performed: no ____________________________________________   INITIAL IMPRESSION / ASSESSMENT AND PLAN / ED COURSE  Pertinent labs & imaging results that were available during my care of the patient were reviewed by me and considered in my medical decision making (see chart for details).   DDX: contusion, abrasion, laceration, fracture, dislocation  Krista Phillips is a 84 y.o. who presents to the ED with left-sided upper extremity pain and fall as described above.  She is nontoxic-appearing no acute distress.  Clinical Course as of Aug 12 1414  Wed Aug 12, 2018  0953 Discussed results with family.  I consulted Dr. Rudene Christians regarding the left shoulder film.  No indication for emergent intervention.  Will place in shoulder immobilizer.  Family also requesting CT head and as they are concerned that she hit her head.  This seems reasonable although she is not a reliable historian.   [PR]  1318 Patient did  very poorly with physical therapy.  Would likely benefit from skilled nursing facility.  Still awaiting social work consultation.   [PR]  2426 Evaluated by PT as well as social work.  Agreeable to going home with wheelchair as well as home health Pt.  Orders placed in epic.  Patient is homebound due to dementia, frailty, pain interfering with mobility and recent humerus fracture.  Will require home health.   [PR]    Clinical Course User Index [PR] Merlyn Lot, MD     As part of my medical decision making, I reviewed the following data within the [facility] notes reviewed and incorporated, Labs reviewed, notes from prior ED visits and [place] Controlled Substance Database   ____________________________________________   FINAL CLINICAL IMPRESSION(S) / ED DIAGNOSES  Final diagnoses:  Fall, initial encounter  Closed 2-part displaced fracture of surgical neck of left humerus, initial encounter      NEW MEDICATIONS STARTED DURING THIS VISIT:  New Prescriptions   HYDROCODONE-ACETAMINOPHEN (HYCET) 7.5-325 MG/15 ML SOLUTION    Take 5 mLs by mouth every 6 (six) hours as needed for severe pain.   LIDOCAINE (LIDODERM) 5 %    Place 1 patch onto the skin every 12 (twelve) hours. Remove &  Discard patch within 12 hours or as directed by MD     Note:  This document was prepared using Dragon voice recognition software and may include unintentional dictation errors.    Merlyn Lot, MD 08/12/18 1416

## 2018-08-12 NOTE — ED Notes (Signed)
ED Provider at bedside. 

## 2018-08-12 NOTE — Evaluation (Signed)
Physical Therapy Evaluation Patient Details Name: Krista Phillips MRN: [id number] DOB: [birth year] Today's Date: 08/12/2018   History of Present Illness  presented to ER secondary after fall at ALF with acute onset of L shoulder pain; noted with L proximal (surgical neck) humeral fracture.  Clinical Impression  Upon evaluation, patient alert and oriented to self; follows simple commands with increased time for processing and task initiation.  L UE immobilized in sling; appropriate finger movement and positioning of extremity noted.  Currently requiring mod assist for bed mobility; mod assist +2 with R HHA for sit/stand; max assist +2 with R HHA for stepping/gait efforts. Patient very forward flexed, unbalance/unsteady in all planes; poor WBing/stability in R LE (baseline deficit).  Unsafe/unable to attempt gait beyond 1-2 steps (with very poor L LE advancement).  Recommend +2 for all transfer attempts and use of manual WC as primary mobility at this time. Would benefit from skilled PT to address above deficits and promote optimal return to PLOF; recommend transition to STR upon discharge from acute hospitalization.   Patient suffers from L humeral fracture which impairs his/her ability to perform daily activities like toileting, feeding, dressing, grooming, bathing in the home. A cane, walker, crutch will not resolve the patient's issue with performing activities of daily living. A wheelchair is required/recommended and will allow patient to safely perform daily activities.   Patient can safely propel the wheelchair in the home or has a caregiver who can provide assistance.      Follow Up Recommendations SNF    Equipment Recommendations       Recommendations for Other Services       Precautions / Restrictions Precautions Precautions: Fall Required Braces or Orthoses: (L UE immobilizer) Restrictions Weight Bearing Restrictions: Yes LUE Weight Bearing: Non weight bearing      Mobility  Bed Mobility Overal bed mobility: Needs Assistance Bed Mobility: Sit to Supine;Supine to Sit     Supine to sit: Mod assist Sit to supine: Mod assist;Max assist      Transfers Overall transfer level: Needs assistance Equipment used: 1 person hand held assist Transfers: Sit to/from Stand Sit to Stand: Mod assist;+2 physical assistance         General transfer comment: R HHA for safety with transfer; patient very fearful  Ambulation/Gait Ambulation/Gait assistance: Mod assist;Max assist;+2 physical assistance Gait Distance (Feet): 2 Feet Assistive device: 1 person hand held assist       General Gait Details: forward flexed, kyphotic posture; very short, shuffling steps; fair R LE step length, but very minimal ability to advance L LE (fearful/hesitant for WBing R LE).  Generally unbalance/unsteady, requirign +2 for safety at all times.  Unable to safely participate with ambulation as primary mobility at this time.  Stairs            Wheelchair Mobility    Modified Rankin (Stroke Patients Only)       Balance Overall balance assessment: Needs assistance Sitting-balance support: No upper extremity supported;Feet supported Sitting balance-Leahy Scale: Fair     Standing balance support: Single extremity supported Standing balance-Leahy Scale: Poor                               Pertinent Vitals/Pain Pain Assessment: Faces Faces Pain Scale: Hurts little more Pain Location: L UE Pain Descriptors / Indicators: Aching;Grimacing;Guarding Pain Intervention(s): Limited activity within patient's tolerance;Monitored during session;Repositioned    Home Living Family/patient expects to be discharged to:: Assisted  living                 Additional Comments: Long term resident of [facility] memory care     Prior Function Level of Independence: Needs assistance         Comments: Ambulates to/from dining hall with RW, sup; assist from staff as  needed for ADLs.  Family does report multiple fall history.     Hand Dominance        Extremity/Trunk Assessment   Upper Extremity Assessment Upper Extremity Assessment: (R UE grossly WFL for functional activities; L UE immobilized)    Lower Extremity Assessment Lower Extremity Assessment: (grossly at least 4-/5 throughout, somewhat difficult to assess due to cognitive deficits)       Communication   Communication: No difficulties  Cognition Arousal/Alertness: Awake/alert Behavior During Therapy: WFL for tasks assessed/performed Overall Cognitive Status: History of cognitive impairments - at baseline                                        General Comments      Exercises Other Exercises Other Exercises: Sit/stand x3 with R HHA, mod assist +2 Other Exercises: Multiple attempts at standing/stepping for gait assessment/training; poor tolerance for R LE WBing (baseline deviation per previous notes).  Very unsteady and fearful.  Recommend +2 for all transfer attempts and use of manual WC as primary mobility at this time. Other Exercises: Family with multiple questions about wearing schedule sling; referred back to physician for specific instructions.  did review hemi-dressing techniques to optimally protect L UE; family voiced understanding.   Assessment/Plan    PT Assessment Patient needs continued PT services  PT Problem List Decreased strength;Decreased activity tolerance;Decreased balance;Decreased coordination;Decreased range of motion;Decreased mobility;Decreased cognition;Decreased knowledge of use of DME;Decreased safety awareness;Decreased knowledge of precautions;Pain;Decreased skin integrity       PT Treatment Interventions DME instruction;Gait training;Functional mobility training;Therapeutic activities;Therapeutic exercise;Balance training;Patient/family education    PT Goals (Current goals can be found in the Care Plan section)  Acute Rehab PT  Goals Patient Stated Goal: to restore mobility PT Goal Formulation: With patient/family Time For Goal Achievement: 08/26/18 Potential to Achieve Goals: Fair    Frequency 7X/week   Barriers to discharge        Co-evaluation               AM-PAC PT "6 Clicks" Mobility  Outcome Measure Help needed turning from your back to your side while in a flat bed without using bedrails?: A Lot Help needed moving from lying on your back to sitting on the side of a flat bed without using bedrails?: A Lot Help needed moving to and from a bed to a chair (including a wheelchair)?: A Lot Help needed standing up from a chair using your arms (e.g., wheelchair or bedside chair)?: A Lot Help needed to walk in hospital room?: Total Help needed climbing 3-5 steps with a railing? : Total 6 Click Score: 10    End of Session Equipment Utilized During Treatment: Gait belt Activity Tolerance: Patient tolerated treatment well Patient left: in bed;with call bell/phone within reach;with family/visitor present Nurse Communication: Mobility status PT Visit Diagnosis: Muscle weakness (generalized) (M62.81);Difficulty in walking, not elsewhere classified (R26.2);Unsteadiness on feet (R26.81);History of falling (Z91.81)    Time: 7619-5093 PT Time Calculation (min) (ACUTE ONLY): 27 min   Charges:   PT Evaluation $PT Eval Moderate Complexity:  1 Mod PT Treatments $Therapeutic Activity: 8-22 mins        Raven Furnas H. Owens Shark, PT, DPT, NCS 08/12/18, 2:06 PM [phone]

## 2018-08-12 NOTE — Clinical Social Work Note (Signed)
CSW consulted for DME and PT needs. CSW notified RNCM of needs. CSW signing off. Please re consult if further needs arise.   [place], [place]

## 2018-08-12 NOTE — ED Notes (Signed)
Contacted cafeteria several times regarding patient tray. Offered patient sandwich and apple sauce in meantime- family denied.

## 2018-08-12 NOTE — ED Notes (Signed)
Pt changed into her pajamas. Brief changed. Family updated on POC

## 2018-08-12 NOTE — Clinical Social Work Note (Signed)
Patient is a long term resident at [facility]. CSW spoke with [facility] and they report that they can accept patient back to facility. CSW informed them that patient will have a wheelchair and has weight bearing restrictions for her arm. Patient will also be followed by [facility]. CSW also met with patient and family at bedside. Family is in agreement with discharge back to [facility] with PT through [facility]. CSW notified RN that patient will transport back to facility by EMS.     MSW, LCSWA [phone] 

## 2018-08-12 NOTE — Discharge Instructions (Addendum)
The sling may be removed for dressing as well as bathing.  Please follow-up with orthopedics in 1 week for repeat x-ray.  I have also ordered a wheelchair as well as home health physical therapy.  Return for any additional questions or concerns.

## 2018-08-12 NOTE — Care Management Note (Signed)
Case Management Note  Patient Details  Name: Krista Phillips MRN: [id number] Date of Birth: 11-20-1926   Patient to return to [facility] memory care today.  Patient's sister and nieces at bedside.  Patient to have home health PT and WC at the facility.  RNCM provided home health agency preference list.  Family state they do not have a preference of agency.  Referral made to [facility] with [place].  Niece pick up WC at the retail store after discharge.   Subjective/Objective:                    Action/Plan:   Expected Discharge Date:                  Expected Discharge Plan:  Assisted Living / Rest Home(with home health )  In-House Referral:     Discharge planning Services  CM Consult  Post Acute Care Choice:  Durable Medical Equipment, Home Health Choice offered to:  Sibling  DME Arranged:  Wheelchair manual DME Agency:  [place]:  PT [place]:  [place]  Status of Service:  Completed, signed off  If discussed at [place] of Stay Meetings, dates discussed:    Additional Comments:  Beverly Sessions, RN 08/12/2018, 2:04 PM

## 2018-08-12 NOTE — ED Notes (Signed)
Awaiting PT assessment.

## 2018-08-12 NOTE — Progress Notes (Signed)
PT Cancellation Note  Patient Details Name: Krista Phillips MRN: [id number] DOB: Dec 24, 1926   Cancelled Treatment:    Reason Eval/Treat Not Completed: Medical issues which prohibited therapy(Consult received and chart reviewed.  Patient noted with orders for head CT and c-spine imaging; will hold evaluation until results received and patient cleared for activity.  Will continue to follow.)   Ender Rorke H. Owens Shark, PT, DPT, NCS 08/12/18, 10:11 AM [phone]

## 2018-08-12 NOTE — ED Triage Notes (Signed)
Arrived via EMS with report of fall in shower. Pt is Alert and able to recall events. States she was in the shower and lost her footing and slipped. No LOC and did not hit her head. Pt has a skin tear that is covered on her Left elbow and states her elbow is sore. No pain to her left wrist or shoulder or any other area

## 2018-08-13 ENCOUNTER — Telehealth: Payer: Self-pay | Admitting: Student

## 2018-08-13 ENCOUNTER — Encounter: Payer: Medicare Other | Admitting: Student

## 2018-08-13 NOTE — Telephone Encounter (Signed)
NP phone call: NP spoke with niece [name] regarding patient returning to [facility] with [facility] therapy. She states patient has a lot of bruising present today s/p fall. She is made aware that patient will be seen on Tuesday by Palliative Medicine. She denies any other needs at this time.

## 2018-08-13 NOTE — Telephone Encounter (Signed)
08/12/18 @ 8:22am. NP returned call to patient's niece Krista Phillips. She states patient had a fall this morning and is presently at emergency room being evaluated. She is made aware of Hospital liaison to follow up on patient's disposition. She verbalizes understanding.

## 2018-08-18 ENCOUNTER — Encounter: Payer: Self-pay | Admitting: Student

## 2018-08-18 ENCOUNTER — Non-Acute Institutional Stay: Payer: Medicare Other | Admitting: Student

## 2018-08-18 VITALS — BP 118/62 | HR 88 | Resp 16

## 2018-08-18 DIAGNOSIS — I509 Heart failure, unspecified: Secondary | ICD-10-CM

## 2018-08-18 DIAGNOSIS — S42222D 2-part displaced fracture of surgical neck of left humerus, subsequent encounter for fracture with routine healing: Secondary | ICD-10-CM

## 2018-08-18 DIAGNOSIS — F0390 Unspecified dementia without behavioral disturbance: Secondary | ICD-10-CM

## 2018-08-18 NOTE — Progress Notes (Signed)
FOLLOW UP PALLIATIVE CARE CONSULT VISIT   PATIENT NAME: Krista Phillips DOB: 04-20-1927 MRN: [id number]  REFERRING PROVIDER: Adin Hector, [place], [place] [address]   RESPONSIBLE PARTY:  Acct ID - Guarantor Home Phone Work Phone Relationship Acct Type  [id number] [facility] [id number]  Self P/F     [place] (Spouse), ([place]), [place], [place] [address]     BILLABLE ICD-10: Z51.5     RECOMMENDATIONS and PLAN:  1. Medical goals of therapy: At this time goals of therapy are focused on patient receiving HH PT, OT, SN services at [facility]. Will monitor closely for changes and declines. Will continue to follow and refer for Hospice admission assessment when patient meets eligibility criteria per guidelines for Dementia or Heart Failure. Patient is encouraged to follow up with Orthopedics as scheduled. 2. Symptom management: pain-continue hydrocodone-acetaminophen 7.5-325mg /42ml take 38ml every 6 hours prn; lidoderm patch as directed.  3. Discharge Planning: Patient will continue to reside at [facility]. Palliative SW notified as family is interested in information regarding higher level of care/skilled nursing facility. 4. Emotional/spiritual support: Discussed with patient, sister Krista Phillips, niece Krista Phillips and facility staff; they are encouraged to call with questions.   Palliative Care to continue to follow for emotional/spiritual support, ongoing discussions trajectory of chronic disease progression, medical goals of therapy, monitor for symptoms with management, and reduce ED and hospitalizations with recommendations.   I spent 40 minutes providing this consultation,  from 9:30am to 10:10am. More than 50% of the time in this consultation was spent coordinating communication.    HISTORY OF PRESENT ILLNESS:  Krista Phillips is an 84 year old with significant past medical history of  dementia, congestive heart failure,  Vitamin B12 deficiency, hyperglycemia, hypertension, osteoarthritis, thoracic compression fracture. She was seen in emergency room on 08/12/2018 due to fall and found to have a left humerus fracture. She was discharged with immobilzer and with [facility]. She currently resides at [facility] on locked memory unit. She is dependent for activities of daily living; she has required assist x 2 for transfers since fall. Her appetite varies; staff report she eats better depending on what is on the menu. She has been able to feed herself. Most recent weight is 134 pounds 08/2018. Her family reports her being weaker. Therapy has performed evaluation and SN has visited per family and staff. Family reports patient sleeping more. She denies having pain at present. Med tech Ezzard Flax reports patient complaining of occasional pain and receiving hydrocodone-acetaminophen with effectiveness.   CODE STATUS: DNR  ADVANCED DIRECTIVES: N  PPS: 40% HOSPICE ELIGIBILITY/DIAGNOSIS: No, monitoring for eligibility criteria.    PHYSICAL EXAM:   GEN: alert, oriented to person, no acute distress  LUNGS: clear to auscultation  CARDIAC: regular rate, rhythm, murmur present  EXTREMITIES: 2+ and non-pitting edema SKIN: warm and dry, scattered bruising to neck, chest, upper back, left upper extremity  NEURO: negative    Ezekiel Slocumb, NP

## 2018-08-20 ENCOUNTER — Other Ambulatory Visit: Payer: Self-pay

## 2018-08-20 ENCOUNTER — Inpatient Hospital Stay
Admission: EM | Admit: 2018-08-20 | Discharge: 2018-08-24 | DRG: 292 | Disposition: A | Discharge status: Skilled Nursing Facility | Payer: Medicare Other | Attending: Internal Medicine | Admitting: Internal Medicine

## 2018-08-20 ENCOUNTER — Emergency Department: Payer: Medicare Other

## 2018-08-20 DIAGNOSIS — Z791 Long term (current) use of non-steroidal anti-inflammatories (NSAID): Secondary | ICD-10-CM

## 2018-08-20 DIAGNOSIS — S42309A Unspecified fracture of shaft of humerus, unspecified arm, initial encounter for closed fracture: Secondary | ICD-10-CM | POA: Diagnosis present

## 2018-08-20 DIAGNOSIS — I509 Heart failure, unspecified: Secondary | ICD-10-CM

## 2018-08-20 DIAGNOSIS — N390 Urinary tract infection, site not specified: Secondary | ICD-10-CM | POA: Diagnosis present

## 2018-08-20 DIAGNOSIS — E876 Hypokalemia: Secondary | ICD-10-CM | POA: Diagnosis present

## 2018-08-20 DIAGNOSIS — I10 Essential (primary) hypertension: Secondary | ICD-10-CM | POA: Diagnosis present

## 2018-08-20 DIAGNOSIS — R609 Edema, unspecified: Secondary | ICD-10-CM

## 2018-08-20 DIAGNOSIS — R601 Generalized edema: Secondary | ICD-10-CM | POA: Diagnosis present

## 2018-08-20 DIAGNOSIS — F0390 Unspecified dementia without behavioral disturbance: Secondary | ICD-10-CM | POA: Diagnosis present

## 2018-08-20 DIAGNOSIS — I110 Hypertensive heart disease with heart failure: Principal | ICD-10-CM | POA: Diagnosis present

## 2018-08-20 DIAGNOSIS — I5023 Acute on chronic systolic (congestive) heart failure: Secondary | ICD-10-CM | POA: Diagnosis present

## 2018-08-20 DIAGNOSIS — Z66 Do not resuscitate: Secondary | ICD-10-CM | POA: Diagnosis present

## 2018-08-20 DIAGNOSIS — I5022 Chronic systolic (congestive) heart failure: Secondary | ICD-10-CM | POA: Diagnosis present

## 2018-08-20 DIAGNOSIS — N3000 Acute cystitis without hematuria: Secondary | ICD-10-CM | POA: Diagnosis present

## 2018-08-20 DIAGNOSIS — W182XXA Fall in (into) shower or empty bathtub, initial encounter: Secondary | ICD-10-CM | POA: Diagnosis present

## 2018-08-20 DIAGNOSIS — I429 Cardiomyopathy, unspecified: Secondary | ICD-10-CM | POA: Diagnosis present

## 2018-08-20 DIAGNOSIS — S42202A Unspecified fracture of upper end of left humerus, initial encounter for closed fracture: Secondary | ICD-10-CM | POA: Diagnosis present

## 2018-08-20 DIAGNOSIS — Z79899 Other long term (current) drug therapy: Secondary | ICD-10-CM

## 2018-08-20 LAB — BASIC METABOLIC PANEL
Anion gap: 7 (ref 5–15)
BUN: 17 mg/dL (ref 8–23)
CO2: 23 mmol/L (ref 22–32)
Calcium: 8.6 mg/dL — ABNORMAL LOW (ref 8.9–10.3)
Chloride: 106 mmol/L (ref 98–111)
Creatinine, Ser: 0.7 mg/dL (ref 0.44–1.00)
GFR calc Af Amer: >60 mL/min (ref >60)
GFR calc non Af Amer: >60 mL/min (ref >60)
Glucose, Bld: 172 mg/dL — ABNORMAL HIGH (ref 70–99)
Potassium: 4.1 mmol/L (ref 3.5–5.1)
SODIUM: 136 mmol/L (ref 135–145)

## 2018-08-20 LAB — CBC
HCT: 35.6 % — ABNORMAL LOW (ref 36.0–46.0)
HEMOGLOBIN: 11.3 g/dL — AB (ref 12.0–15.0)
MCH: 30.5 pg (ref 26.0–34.0)
MCHC: 31.7 g/dL (ref 30.0–36.0)
MCV: 96 fL (ref 80.0–100.0)
Platelets: 326 10*3/uL (ref 150–400)
RBC: 3.71 MIL/uL — ABNORMAL LOW (ref 3.87–5.11)
RDW: 15.5 % (ref 11.5–15.5)
WBC: 12.4 10*3/uL — ABNORMAL HIGH (ref 4.0–10.5)
nRBC: 0 % (ref 0.0–0.2)

## 2018-08-20 LAB — URINALYSIS, COMPLETE (UACMP) WITH MICROSCOPIC
Bilirubin Urine: NEGATIVE
Glucose, UA: 50 mg/dL — AB
Ketones, ur: NEGATIVE mg/dL
Nitrite: NEGATIVE
Protein, ur: NEGATIVE mg/dL
SQUAMOUS EPITHELIAL / LPF: NONE SEEN (ref 0–5)
Specific Gravity, Urine: 1.002 — ABNORMAL LOW (ref 1.005–1.030)
WBC, UA: >50 WBC/hpf — ABNORMAL HIGH (ref 0–5)
pH: 6 (ref 5.0–8.0)

## 2018-08-20 LAB — TROPONIN I
Troponin I: 0.03 ng/mL (ref <0.03)
Troponin I: 0.03 ng/mL (ref <0.03)

## 2018-08-20 LAB — HEPATIC FUNCTION PANEL
ALT: 13 U/L (ref 0–44)
AST: 23 U/L (ref 15–41)
Albumin: 3 g/dL — ABNORMAL LOW (ref 3.5–5.0)
Alkaline Phosphatase: 136 U/L — ABNORMAL HIGH (ref 38–126)
Bilirubin, Direct: 0.3 mg/dL — ABNORMAL HIGH (ref 0.0–0.2)
Indirect Bilirubin: 1 mg/dL — ABNORMAL HIGH (ref 0.3–0.9)
TOTAL PROTEIN: 6.3 g/dL — AB (ref 6.5–8.1)
Total Bilirubin: 1.3 mg/dL — ABNORMAL HIGH (ref 0.3–1.2)

## 2018-08-20 LAB — BRAIN NATRIURETIC PEPTIDE: B Natriuretic Peptide: 84 pg/mL (ref 0.0–100.0)

## 2018-08-20 MED ORDER — ACETAMINOPHEN 325 MG PO TABS: 650.0000 mg | ORAL_TABLET | Freq: Four times a day (QID) | ORAL | Status: DC | PRN

## 2018-08-20 MED ORDER — CARVEDILOL 6.25 MG PO TABS
3.1250 mg | ORAL_TABLET | Freq: Two times a day (BID) | ORAL | Status: DC
  Administered 2018-08-21 – 2018-08-24 (×6): 3.125 mg via ORAL
  Filled 2018-08-20 (×8): qty 1

## 2018-08-20 MED ORDER — SODIUM CHLORIDE 0.9 % IV SOLN
1.0000 g | INTRAVENOUS | Status: DC
  Administered 2018-08-21 – 2018-08-23 (×3): 1 g via INTRAVENOUS
  Filled 2018-08-20: qty 1
  Filled 2018-08-20: qty 10
  Filled 2018-08-20 (×2): qty 1

## 2018-08-20 MED ORDER — OXYCODONE HCL 5 MG PO TABS
5.0000 mg | ORAL_TABLET | ORAL | Status: DC | PRN
  Administered 2018-08-22: 5 mg via ORAL
  Filled 2018-08-20: qty 1

## 2018-08-20 MED ORDER — ONDANSETRON HCL 4 MG PO TABS: 4.0000 mg | ORAL_TABLET | Freq: Four times a day (QID) | ORAL | Status: DC | PRN

## 2018-08-20 MED ORDER — ACETAMINOPHEN 650 MG RE SUPP: 650.0000 mg | Freq: Four times a day (QID) | RECTAL | Status: DC | PRN

## 2018-08-20 MED ORDER — SODIUM CHLORIDE 0.9 % IV SOLN
1.0000 g | Freq: Once | INTRAVENOUS | Status: AC
  Administered 2018-08-20: 1 g via INTRAVENOUS
  Filled 2018-08-20: qty 10

## 2018-08-20 MED ORDER — MORPHINE SULFATE (PF) 2 MG/ML IV SOLN: 2.0000 mg | INTRAVENOUS | Status: DC | PRN

## 2018-08-20 MED ORDER — FUROSEMIDE 10 MG/ML IJ SOLN
40.0000 mg | Freq: Once | INTRAMUSCULAR | Status: AC
  Administered 2018-08-20: 40 mg via INTRAVENOUS
  Filled 2018-08-20: qty 4

## 2018-08-20 MED ORDER — ONDANSETRON HCL 4 MG/2ML IJ SOLN: 4.0000 mg | Freq: Four times a day (QID) | INTRAMUSCULAR | Status: DC | PRN

## 2018-08-20 MED ORDER — ENOXAPARIN SODIUM 40 MG/0.4ML ~~LOC~~ SOLN
40.0000 mg | SUBCUTANEOUS | Status: DC
  Administered 2018-08-21 – 2018-08-23 (×3): 40 mg via SUBCUTANEOUS
  Filled 2018-08-20 (×3): qty 0.4

## 2018-08-20 NOTE — ED Notes (Signed)
Report given to John RN.

## 2018-08-20 NOTE — ED Triage Notes (Signed)
Pt brought by family from Dr. Caryl Comes for swelling to L arm and to "whole body" per family. Pt broke L humerus last Wednesday. In shoulder immobilizer. Pt has good cap refill in L hand. L hand is warm to touch. Good color. Pt also has bilat leg swelling. Has CHF, was taken off her fluid pill in October. Family states pt hasn't been walking d/t being to weak.   Lives at [facility] memory care.

## 2018-08-20 NOTE — ED Notes (Signed)
FIRST NURSE NOTE:  Pt arrived via wheelchair from [facility]'s office with reports of left humerus fracture that has not been followed up with by ortho, pt having difficulty ambulating, which she normally does not have problems, anasarca and significant swelling the left arm.  Pt is from an assisted living facility but is accompanied by family.

## 2018-08-20 NOTE — H&P (Signed)
[place] [facility] NAME: Krista Phillips    MR#:  [id number]  DATE OF BIRTH:  1927-08-02  DATE OF ADMISSION:  08/20/2018  PRIMARY CARE PHYSICIAN: Adin Hector, MD   REQUESTING/REFERRING PHYSICIAN: Quentin Cornwall, MD  CHIEF COMPLAINT:  Anasarca  HISTORY OF PRESENT ILLNESS:  Krista Phillips  is a 84 y.o. female who presents with chief complaint as above.  Patient presents with increased swelling of her lower extremities.  Reportedly she was recently taken off of her Lasix dose due to low blood pressure.  She does not contribute much information to HPI, family is at bedside and provides information.  Patient also had a recent fall and humerus fracture, but has not been able to be seen by orthopedics yet.  Patient also found to have likely UTI on evaluation here in the ED.  Hospitalist were called for admission  PAST MEDICAL HISTORY:   Past Medical History:  Diagnosis Date  . B12 deficiency   . Cardiomyopathy ([place])   . CHF (congestive heart failure) ([place])   . Degenerative arthritis   . Hypertension   . Left bundle branch block (LBBB)      PAST SURGICAL HISTORY:   Past Surgical History:  Procedure Laterality Date  . DILATION AND CURETTAGE OF UTERUS       SOCIAL HISTORY:   Social History   Tobacco Use  . Smoking status: Never Smoker  . Smokeless tobacco: Never Used  Substance Use Topics  . Alcohol use: No     FAMILY HISTORY:   Family History  Problem Relation Age of Onset  . Ovarian cancer Mother   . Ulcers Father   . Peptic Ulcer Father   . Colon cancer Sister      DRUG ALLERGIES:  No Known Allergies  MEDICATIONS AT HOME:   Prior to Admission medications   Medication Sig Start Date End Date Taking? Authorizing Provider  acetaminophen (TYLENOL) 500 MG tablet Take 500 mg by mouth every 6 (six) hours as needed for mild pain, moderate pain or fever.   Yes [provider]  carvedilol (COREG) 3.125 MG  tablet Take 3.125 mg by mouth 2 (two) times daily with a meal.   Yes [provider]  diclofenac sodium (VOLTAREN) 1 % GEL Apply 4 g topically 3 (three) times daily as needed (arthritis pain).   Yes [provider]  guaiFENesin (ROBITUSSIN) 100 MG/5ML SOLN Take 5 mLs (100 mg total) by mouth every 4 (four) hours as needed for cough or to loosen phlegm. 06/30/18  Yes Demetrios Loll, MD  HYDROcodone-acetaminophen (HYCET) 7.5-325 mg/15 ml solution Take 5 mLs by mouth every 6 (six) hours as needed for severe pain. 08/12/18 08/12/19 Yes Merlyn Lot, MD  lidocaine (LIDODERM) 5 % Place 1 patch onto the skin every 12 (twelve) hours. Remove & Discard patch within 12 hours or as directed by MD 08/12/18 08/12/19 Yes Merlyn Lot, MD  nystatin-triamcinolone ointment [facility]) Apply 1 application topically 2 (two) times daily as needed (rash).    Yes [provider]  vitamin B-12 (CYANOCOBALAMIN) 1000 MCG tablet Take 1,000 mcg by mouth daily.   Yes [provider]    REVIEW OF SYSTEMS:  Review of Systems  Unable to perform ROS: Dementia     VITAL SIGNS:   Vitals:   08/20/18 1537 08/20/18 1539  BP:  120/66  Pulse:  93  Resp:  18  Temp:  97.6 F (36.4 C)  TempSrc:  Axillary  SpO2:  95%  Weight: 65 kg   Height: 5\' 2"  (1.575 m)    Wt Readings from Last 3 Encounters:  08/20/18 65 kg  08/12/18 65.3 kg  06/28/18 58 kg    PHYSICAL EXAMINATION:  Physical Exam  Vitals reviewed. Constitutional: She appears well-developed and well-nourished. No distress.  HENT:  Head: Normocephalic and atraumatic.  Mouth/Throat: Oropharynx is clear and moist.  Eyes: Pupils are equal, round, and reactive to light. Conjunctivae and EOM are normal. No scleral icterus.  Neck: Normal range of motion. Neck supple. No JVD present. No thyromegaly present.  Cardiovascular: Normal rate, regular rhythm and intact distal pulses. Exam reveals no gallop and no friction rub.  No murmur  heard. Respiratory: Effort normal and breath sounds normal. No respiratory distress. She has no wheezes. She has no rales.  GI: Soft. Bowel sounds are normal. She exhibits no distension. There is no abdominal tenderness.  Musculoskeletal: Normal range of motion.        General: No edema.     Comments: No arthritis, no gout  Lymphadenopathy:    She has no cervical adenopathy.  Neurological: She is alert. No cranial nerve deficit.  No dysarthria, no aphasia  Skin: Skin is warm and dry. No rash noted. No erythema.  Psychiatric:  Unable to fully assess due to dementia    LABORATORY PANEL:   CBC Recent Labs  Lab 08/20/18 1540  WBC 12.4*  HGB 11.3*  HCT 35.6*  PLT 326   ------------------------------------------------------------------------------------------------------------------  Chemistries  Recent Labs  Lab 08/20/18 1540 08/20/18 1823  NA 136  --   K 4.1  --   CL 106  --   CO2 23  --   GLUCOSE 172*  --   BUN 17  --   CREATININE 0.70  --   CALCIUM 8.6*  --   AST  --  23  ALT  --  13  ALKPHOS  --  136*  BILITOT  --  1.3*   ------------------------------------------------------------------------------------------------------------------  Cardiac Enzymes Recent Labs  Lab 08/20/18 1823  TROPONINI 0.03*   ------------------------------------------------------------------------------------------------------------------  RADIOLOGY:  Dg Chest 2 View  Result Date: 08/20/2018 CLINICAL DATA:  LEFT arm swelling, humeral fracture EXAM: CHEST - 2 VIEW COMPARISON:  06/28/2018 FINDINGS: Enlargement of cardiac silhouette. Mediastinal contours and pulmonary vascularity normal. Bibasilar atelectasis. Central peribronchial thickening. Remaining lungs clear. No pleural effusion or pneumothorax. Displaced fracture at surgical neck LEFT humerus without gross dislocation. IMPRESSION: Enlargement of cardiac silhouette with bronchitic changes and bibasilar atelectasis. Displaced  fracture at surgical neck of LEFT humerus. Electronically Signed   By: Lavonia Dana M.D.   On: 08/20/2018 16:11    EKG:   Orders placed or performed during the hospital encounter of 08/20/18  . ED EKG within 10 minutes  . ED EKG within 10 minutes    IMPRESSION AND PLAN:  Principal Problem:   Anasarca -likely due to patient being taken off of her diuretics.  She was given a dose of Lasix in the ED tonight with good urine output.  We will get a cardiology consult for their input on diuretic dose Active Problems:   Humerus fracture -orthopedic surgery consult   UTI (urinary tract infection) -IV antibiotics, urine culture sent   Chronic systolic CHF (congestive heart failure) ([place]) -continue home meds, cardiology consult as above   HTN (hypertension) -continue home meds  Chart review performed and case discussed with ED provider. Labs, imaging and/or ECG reviewed by provider and discussed with patient/family.  Management plans discussed with the patient and/or family.  DVT PROPHYLAXIS: SubQ lovenox   GI PROPHYLAXIS:  None  ADMISSION STATUS: Inpatient     CODE STATUS: Full Code Status History    Date Active Date Inactive Code Status Order ID Comments User Context   06/28/2018 1833 06/30/2018 1625 DNR [id number]  Nicholes Mango, MD ED    Questions for Most Recent Historical Code Status (Order [id number])    Question Answer Comment   In the event of cardiac or respiratory ARREST Do not call a "code blue"    In the event of cardiac or respiratory ARREST Do not perform Intubation, CPR, defibrillation or ACLS    In the event of cardiac or respiratory ARREST Use medication by any route, position, wound care, and other measures to relive pain and suffering. May use oxygen, suction and manual treatment of airway obstruction as needed for comfort.    Comments RN may pronouunce         Advance Directive Documentation     Most Recent Value  Type of Advance Directive  Out of facility DNR (pink  MOST or yellow form)  Pre-existing out of facility DNR order (yellow form or pink MOST form)  -  "MOST" Form in Place?  -      TOTAL TIME TAKING CARE OF THIS PATIENT: 45 minutes.   Jannifer Franklin, [place] FIELDING 08/20/2018, 9:22 PM  [facility]  Office  [phone]  CC: Primary care physician; Adin Hector, MD  Note:  This document was prepared using Dragon voice recognition software and may include unintentional dictation errors.

## 2018-08-20 NOTE — ED Provider Notes (Addendum)
[facility] Emergency Department Provider Note    First MD Initiated Contact with Patient 08/20/18 1748     (approximate)  I have reviewed the triage vital signs and the nursing notes.   HISTORY  Chief Complaint swelling   HPI Krista Phillips is a 84 y.o. female the ER for evaluation of weakness confusion diffuse swelling 8 to 10 pound weight gain and increased confusion.  Family also reporting some increased urinary frequency.  Having difficulty moving around at home.  Is not currently taking any diuretics which she previously was on.  Interval falls.    Past Medical History:  Diagnosis Date  . B12 deficiency   . Cardiomyopathy ([place])   . CHF (congestive heart failure) ([place])   . Degenerative arthritis   . Hypertension   . Left bundle branch block (LBBB)    Family History  Problem Relation Age of Onset  . Ovarian cancer Mother   . Ulcers Father   . Peptic Ulcer Father   . Colon cancer Sister    Past Surgical History:  Procedure Laterality Date  . DILATION AND CURETTAGE OF UTERUS     Patient Active Problem List   Diagnosis Date Noted  . Anasarca 08/20/2018  . Chronic systolic CHF (congestive heart failure) ([place]) 08/20/2018  . Humerus fracture 08/20/2018  . HTN (hypertension) 08/20/2018  . UTI (urinary tract infection) 08/20/2018  . Sepsis ([place]) 06/28/2018      Prior to Admission medications   Medication Sig Start Date End Date Taking? Authorizing Provider  acetaminophen (TYLENOL) 500 MG tablet Take 500 mg by mouth every 6 (six) hours as needed for mild pain, moderate pain or fever.   Yes [provider]  carvedilol (COREG) 3.125 MG tablet Take 3.125 mg by mouth 2 (two) times daily with a meal.   Yes [provider]  diclofenac sodium (VOLTAREN) 1 % GEL Apply 4 g topically 3 (three) times daily as needed (arthritis pain).   Yes [provider]  guaiFENesin (ROBITUSSIN) 100 MG/5ML SOLN Take 5 mLs (100 mg total) by  mouth every 4 (four) hours as needed for cough or to loosen phlegm. 06/30/18  Yes Demetrios Loll, MD  HYDROcodone-acetaminophen (HYCET) 7.5-325 mg/15 ml solution Take 5 mLs by mouth every 6 (six) hours as needed for severe pain. 08/12/18 08/12/19 Yes Merlyn Lot, MD  lidocaine (LIDODERM) 5 % Place 1 patch onto the skin every 12 (twelve) hours. Remove & Discard patch within 12 hours or as directed by MD 08/12/18 08/12/19 Yes Merlyn Lot, MD  nystatin-triamcinolone ointment [facility]) Apply 1 application topically 2 (two) times daily as needed (rash).    Yes [provider]  vitamin B-12 (CYANOCOBALAMIN) 1000 MCG tablet Take 1,000 mcg by mouth daily.   Yes [provider]    Allergies Patient has no known allergies.    Social History Social History   Tobacco Use  . Smoking status: Never Smoker  . Smokeless tobacco: Never Used  Substance Use Topics  . Alcohol use: No  . Drug use: No    Review of Systems Patient denies headaches, rhinorrhea, blurry vision, numbness, shortness of breath, chest pain, edema, cough, abdominal pain, nausea, vomiting, diarrhea, dysuria, fevers, rashes or hallucinations unless otherwise stated above in HPI. ____________________________________________   PHYSICAL EXAM:  VITAL SIGNS: Vitals:   08/20/18 1539  BP: 120/66  Pulse: 93  Resp: 18  Temp: 97.6 F (36.4 C)  SpO2: 95%    Constitutional: Alert frail and elderly appearing Eyes:  Conjunctivae are normal.  Head: Atraumatic. Nose: No congestion/rhinnorhea. Mouth/Throat: Mucous membranes are moist.   Neck: No stridor. Painless ROM.  Cardiovascular: Normal rate, regular rhythm. Grossly normal heart sounds.  Good peripheral circulation. Respiratory: Normal respiratory effort.  No retractions. Lungs CTAB. Gastrointestinal: Soft and nontender. No distention. No abdominal bruits. No CVA tenderness. Genitourinary: deferred Musculoskeletal: 2+ BLE pitting edema.  Obvious ecchymosis  and contusion to LUE. In sling.  Strong peripheral pulses No joint effusions. Neurologic:  . No gross focal neurologic deficits are appreciated. No facial droop Skin:  Skin is warm, dry and intact. No rash noted. Psychiatric: Mood and affect are normal  ____________________________________________   LABS (all labs ordered are listed, but only abnormal results are displayed)  Results for orders placed or performed during the hospital encounter of 08/20/18 (from the past 24 hour(s))  Basic metabolic panel     Status: Abnormal   Collection Time: 08/20/18  3:40 PM  Result Value Ref Range   Sodium 136 135 - 145 mmol/L   Potassium 4.1 3.5 - 5.1 mmol/L   Chloride 106 98 - 111 mmol/L   CO2 23 22 - 32 mmol/L   Glucose, Bld 172 (H) 70 - 99 mg/dL   BUN 17 8 - 23 mg/dL   Creatinine, Ser 0.70 0.44 - 1.00 mg/dL   Calcium 8.6 (L) 8.9 - 10.3 mg/dL   GFR calc non Af Amer >60 >60 mL/min   GFR calc Af Amer >60 >60 mL/min   Anion gap 7 5 - 15  CBC     Status: Abnormal   Collection Time: 08/20/18  3:40 PM  Result Value Ref Range   WBC 12.4 (H) 4.0 - 10.5 K/uL   RBC 3.71 (L) 3.87 - 5.11 MIL/uL   Hemoglobin 11.3 (L) 12.0 - 15.0 g/dL   HCT 35.6 (L) 36.0 - 46.0 %   MCV 96.0 80.0 - 100.0 fL   MCH 30.5 26.0 - 34.0 pg   MCHC 31.7 30.0 - 36.0 g/dL   RDW 15.5 11.5 - 15.5 %   Platelets 326 150 - 400 K/uL   nRBC 0.0 0.0 - 0.2 %  Troponin I - ONCE - STAT     Status: Abnormal   Collection Time: 08/20/18  3:40 PM  Result Value Ref Range   Troponin I 0.03 (HH) <0.03 ng/mL  Troponin I - Once-Timed     Status: Abnormal   Collection Time: 08/20/18  6:23 PM  Result Value Ref Range   Troponin I 0.03 (HH) <0.03 ng/mL  Urinalysis, Complete w Microscopic     Status: Abnormal   Collection Time: 08/20/18  6:23 PM  Result Value Ref Range   Color, Urine YELLOW (A) YELLOW   APPearance CLOUDY (A) CLEAR   Specific Gravity, Urine 1.002 (L) 1.005 - 1.030   pH 6.0 5.0 - 8.0   Glucose, UA 50 (A) NEGATIVE mg/dL   Hgb  urine dipstick SMALL (A) NEGATIVE   Bilirubin Urine NEGATIVE NEGATIVE   Ketones, ur NEGATIVE NEGATIVE mg/dL   Protein, ur NEGATIVE NEGATIVE mg/dL   Nitrite NEGATIVE NEGATIVE   Leukocytes, UA LARGE (A) NEGATIVE   RBC / HPF 0-5 0 - 5 RBC/hpf   WBC, UA >50 (H) 0 - 5 WBC/hpf   Bacteria, UA RARE (A) NONE SEEN   Squamous Epithelial / LPF NONE SEEN 0 - 5   WBC Clumps PRESENT   Brain natriuretic peptide     Status: None   Collection Time: 08/20/18  6:23 PM  Result Value Ref Range   B Natriuretic Peptide 84.0 0.0 - 100.0 pg/mL  Hepatic function panel     Status: Abnormal   Collection Time: 08/20/18  6:23 PM  Result Value Ref Range   Total Protein 6.3 (L) 6.5 - 8.1 g/dL   Albumin 3.0 (L) 3.5 - 5.0 g/dL   AST 23 15 - 41 U/L   ALT 13 0 - 44 U/L   Alkaline Phosphatase 136 (H) 38 - 126 U/L   Total Bilirubin 1.3 (H) 0.3 - 1.2 mg/dL   Bilirubin, Direct 0.3 (H) 0.0 - 0.2 mg/dL   Indirect Bilirubin 1.0 (H) 0.3 - 0.9 mg/dL   ____________________________________________ ED ECG REPORT I, Merlyn Lot, the attending physician, personally viewed and interpreted this ECG.   Date: 08/20/2018  EKG Time: 15:52  Rate: 90  Rhythm: sinus  Axis: left  Intervals:lbbb  ST&T Change: no stemi  ____________________________________________  RADIOLOGY  I personally reviewed all radiographic images ordered to evaluate for the above acute complaints and reviewed radiology reports and findings.  These findings were personally discussed with the patient.  Please see medical record for radiology report.  ____________________________________________   PROCEDURES  Procedure(s) performed:  Procedures    Critical Care performed: no ____________________________________________   INITIAL IMPRESSION / ASSESSMENT AND PLAN / ED COURSE  Pertinent labs & imaging results that were available during my care of the patient were reviewed by me and considered in my medical decision making (see chart for  details).   DDX: Anasarca, AKI, congestive heart failure, anemia, UTI, sepsis  Zakaria Fromer is a 84 y.o. who presents to the ED with symptoms as described above.  Patient does demonstrate volume overload and worsening edema.  Blood will be sent for the above differential.  The patient will be placed on continuous pulse oximetry and telemetry for monitoring.  Laboratory evaluation will be sent to evaluate for the above complaints.     Clinical Course as of Aug 20 2113  Thu Aug 20, 2018  2018 Family report 8 to 10 pound weight gain.  No history of liver disease.  Albumin is low but not critically so.  Is no proteinuria but does have rare bacteria many whites which may explain some of the weakness.  BNP is normal but does have significant swelling.  Given her weight gain edema I do feel she would benefit from a dose of Lasix and evaluation the hospital.   [PR]    Clinical Course User Index [PR] Merlyn Lot, MD     As part of my medical decision making, I reviewed the following data within the [facility] notes reviewed and incorporated, Labs reviewed, notes from prior ED visits .   ____________________________________________   FINAL CLINICAL IMPRESSION(S) / ED DIAGNOSES  Final diagnoses:  Edema, unspecified type  Acute cystitis without hematuria      NEW MEDICATIONS STARTED DURING THIS VISIT:  New Prescriptions   No medications on file     Note:  This document was prepared using Dragon voice recognition software and may include unintentional dictation errors.    Merlyn Lot, MD 08/20/18 2114    Merlyn Lot, MD 08/29/18 [phone]

## 2018-08-21 LAB — BASIC METABOLIC PANEL
Anion gap: 6 (ref 5–15)
BUN: 16 mg/dL (ref 8–23)
CO2: 26 mmol/L (ref 22–32)
Calcium: 8.5 mg/dL — ABNORMAL LOW (ref 8.9–10.3)
Chloride: 108 mmol/L (ref 98–111)
Creatinine, Ser: 0.84 mg/dL (ref 0.44–1.00)
GFR calc Af Amer: >60 mL/min (ref >60)
GFR calc non Af Amer: >60 mL/min (ref >60)
Glucose, Bld: 148 mg/dL — ABNORMAL HIGH (ref 70–99)
Potassium: 4.1 mmol/L (ref 3.5–5.1)
Sodium: 140 mmol/L (ref 135–145)

## 2018-08-21 LAB — CBC
HCT: 32.9 % — ABNORMAL LOW (ref 36.0–46.0)
Hemoglobin: 10.6 g/dL — ABNORMAL LOW (ref 12.0–15.0)
MCH: 30.2 pg (ref 26.0–34.0)
MCHC: 32.2 g/dL (ref 30.0–36.0)
MCV: 93.7 fL (ref 80.0–100.0)
Platelets: 347 10*3/uL (ref 150–400)
RBC: 3.51 MIL/uL — AB (ref 3.87–5.11)
RDW: 15.6 % — ABNORMAL HIGH (ref 11.5–15.5)
WBC: 12 10*3/uL — ABNORMAL HIGH (ref 4.0–10.5)
nRBC: 0 % (ref 0.0–0.2)

## 2018-08-21 LAB — TROPONIN I: Troponin I: 0.03 ng/mL (ref <0.03)

## 2018-08-21 LAB — MRSA PCR SCREENING: MRSA by PCR: POSITIVE — AB

## 2018-08-21 MED ORDER — MUPIROCIN 2 % EX OINT
1.0000 "application " | TOPICAL_OINTMENT | Freq: Two times a day (BID) | CUTANEOUS | Status: DC
  Administered 2018-08-21 – 2018-08-24 (×7): 1 via NASAL
  Filled 2018-08-21: qty 22

## 2018-08-21 MED ORDER — CHLORHEXIDINE GLUCONATE CLOTH 2 % EX PADS
6.0000 | MEDICATED_PAD | Freq: Every day | CUTANEOUS | Status: DC
  Administered 2018-08-21 – 2018-08-24 (×2): 6 via TOPICAL

## 2018-08-21 MED ORDER — FUROSEMIDE 10 MG/ML IJ SOLN
20.0000 mg | Freq: Two times a day (BID) | INTRAMUSCULAR | Status: DC
  Administered 2018-08-21 – 2018-08-22 (×2): 20 mg via INTRAVENOUS
  Filled 2018-08-21 (×2): qty 2

## 2018-08-21 MED ORDER — FUROSEMIDE 10 MG/ML IJ SOLN
40.0000 mg | Freq: Two times a day (BID) | INTRAMUSCULAR | Status: DC
  Administered 2018-08-21: 40 mg via INTRAVENOUS
  Filled 2018-08-21: qty 4

## 2018-08-21 NOTE — Evaluation (Signed)
Physical Therapy Evaluation Patient Details Name: Krista Phillips MRN: [id number] DOB: March 14, 1927 Today's Date: 08/21/2018   History of Present Illness  84 year old female with history of dementia and chronic systolic heart failure ejection fraction 40 to 45%, with chronic left bundle branch block ,who presented due to lower extremity edema. L HUMERUS FX from fall 08/13/18 that is in sling, NWB, with no plan for surgical intervention  Clinical Impression  Patient is an 84 year old female presenting with impairments in general strength, ROM, activity tolerance, balance, and pain (L humerus fx). Patient is currently unable to complete bed mobility ind (requring +1 minA), or transfer chair <> chair with 2+ modA, or complete STS transfers or tolerate standing activity. Patient is currently unable to participate in her prior environment at [facility] to complete ADLs safely d/t mobility impairments. Would benefit from skilled PT to address above deficits and promote optimal return to PLOF     Follow Up Recommendations LTACH    Equipment Recommendations       Recommendations for Other Services OT consult     Precautions / Restrictions Precautions Precautions: Fall Required Braces or Orthoses: Sling Restrictions Weight Bearing Restrictions: No LUE Weight Bearing: Non weight bearing      Mobility  Bed Mobility Overal bed mobility: Needs Assistance Bed Mobility: Sit to Supine;Supine to Sit;Sit to Sidelying     Supine to sit: Min guard Sit to supine: Min guard Sit to sidelying: Min guard General bed mobility comments: bed > EOB patient requires assistance d/t immoblization of LUE and lethargy. Able to tolerate EOB sitting for 67mins with unilateral UE support modI  Transfers Overall transfer level: Needs assistance   Transfers: Squat Pivot Transfers     Squat pivot transfers: +2 physical assistance;Mod assist     General transfer comment: Reaches with RUE to chair and is able to  lift bottom with PT assistance with other PT blocking L side, and able to comply with cuing for safety with shifting and lowering  Ambulation/Gait             General Gait Details: Not assessed d/t safety concerns to date  Stairs            Wheelchair Mobility    Modified Rankin (Stroke Patients Only)       Balance Overall balance assessment: Needs assistance Sitting-balance support: No upper extremity supported;Feet supported Sitting balance-Leahy Scale: Poor Sitting balance - Comments: Able to maiontain balance with RUE supported on bed, not with RUE in lap   Standing balance support: No upper extremity supported Standing balance-Leahy Scale: Poor                               Pertinent Vitals/Pain Faces Pain Scale: Hurts a little bit Pain Location: L UE Pain Descriptors / Indicators: Aching Pain Intervention(s): Limited activity within patient's tolerance    Home Living Family/patient expects to be discharged to:: Assisted living               Home Equipment: Walker - 4 wheels;Transport chair Additional Comments: Long term resident of [facility] memory care     Prior Function Level of Independence: Needs assistance   Gait / Transfers Assistance Needed: Currently in transport [facility] with 2 person assist to bed<>chair; prior to fall 12/5 was ambulating with RW (per sisters)  ADL's / Homemaking Assistance Needed: Pt needs assistance with bathing, dressing, and toileting.   Comments: Family gives above  history  Family does report multiple fall history.     Hand Dominance        Extremity/Trunk Assessment   Upper Extremity Assessment Upper Extremity Assessment: LUE deficits/detail LUE: Unable to fully assess due to immobilization         Cervical / Trunk Assessment Cervical / Trunk Assessment: Normal  Communication   Communication: No difficulties  Cognition Arousal/Alertness: Awake/alert Behavior During Therapy: WFL for  tasks assessed/performed Overall Cognitive Status: History of cognitive impairments - at baseline                                        General Comments      Exercises Other Exercises Other Exercises: Completed bed > chair transfer to R with mod 2+ assistance, pt able to comply with cuing for safe shifting and lowering.  Other Exercises: Completed sitting balance and tolerance activities where patient is able to tolerate sitting for 31mins with RUE supported, patient unable to sit without unilateral UE support or maintain sitting with any external challenge Other Exercises: Educated family on purpose of sitting up for increased alertness/lung health.    Assessment/Plan    PT Assessment Patient needs continued PT services  PT Problem List Decreased strength;Decreased activity tolerance;Decreased balance;Decreased coordination;Decreased range of motion;Decreased mobility;Decreased cognition;Decreased knowledge of use of DME;Decreased safety awareness;Decreased knowledge of precautions;Pain;Decreased skin integrity       PT Treatment Interventions DME instruction;Gait training;Functional mobility training;Therapeutic activities;Therapeutic exercise;Balance training;Patient/family education;Neuromuscular re-education;Manual techniques    PT Goals (Current goals can be found in the Care Plan section)  Acute Rehab PT Goals Patient Stated Goal: to restore mobility PT Goal Formulation: With patient/family Time For Goal Achievement: 09/04/18 Potential to Achieve Goals: Fair    Frequency Min 2X/week   Barriers to discharge        Co-evaluation               AM-PAC PT "6 Clicks" Mobility  Outcome Measure Help needed turning from your back to your side while in a flat bed without using bedrails?: A Lot Help needed moving from lying on your back to sitting on the side of a flat bed without using bedrails?: A Lot Help needed moving to and from a bed to a chair  (including a wheelchair)?: A Lot Help needed standing up from a chair using your arms (e.g., wheelchair or bedside chair)?: Total Help needed to walk in hospital room?: Total Help needed climbing 3-5 steps with a railing? : Total 6 Click Score: 9    End of Session   Activity Tolerance: Patient tolerated treatment well Patient left: with chair alarm set;in chair;with family/visitor present;with call bell/phone within reach Nurse Communication: Mobility status PT Visit Diagnosis: Muscle weakness (generalized) (M62.81);Difficulty in walking, not elsewhere classified (R26.2);Unsteadiness on feet (R26.81);History of falling (Z91.81);Other abnormalities of gait and mobility (R26.89)    Time: 0220-0300 PT Time Calculation (min) (ACUTE ONLY): 40 min   Charges:   PT Evaluation $PT Eval Moderate Complexity: 1 Mod PT Treatments $Therapeutic Activity: 8-22 mins        Shelton Silvas PT, DPT  Shelton Silvas 08/21/2018, 3:28 PM

## 2018-08-21 NOTE — Progress Notes (Signed)
[place] [facility] [place] NAME: Krista Phillips    MR#:  [id number]  DATE OF BIRTH:  11-Dec-1926  SUBJECTIVE:   Patient here with lower extremity swelling and recent fall  REVIEW OF SYSTEMS:  Unable to obtain: Dementia Sleeping  Tolerating Diet: yes      DRUG ALLERGIES:  No Known Allergies  VITALS:  Blood pressure 117/60, pulse (!) 103, temperature 99.3 F (37.4 C), temperature source Oral, resp. rate 18, height 5\' 2"  (1.575 m), weight 65 kg, SpO2 99 %.  PHYSICAL EXAMINATION:  Constitutional: Appears well-developed and well-nourished. No distress. HENT: Normocephalic. Marland Kitchen Oropharynx is clear and moist.  Eyes: Conjunctivae and EOM are normal. PERRLA, no scleral icterus.  Neck: Normal ROM. Neck supple. No JVD. No tracheal deviation. CVS: RRR, S1/S2 +, no murmurs, no gallops, no carotid bruit.  Pulmonary: Effort and breath sounds normal, no stridor, rhonchi, wheezes, rales.  Abdominal: Soft. BS +,  no distension, tenderness, rebound or guarding.  Musculoskeletal: 2+ lower extremity edema and no tenderness.  Does not move left upper extremity Neuro: Sleeping. Skin: Skin is warm and dry. No rash noted. Bruising noted on neck Psychiatric: Sleeping     LABORATORY PANEL:   CBC Recent Labs  Lab 08/21/18 0016  WBC 12.0*  HGB 10.6*  HCT 32.9*  PLT 347   ------------------------------------------------------------------------------------------------------------------  Chemistries  Recent Labs  Lab 08/20/18 1823 08/21/18 0016  NA  --  140  K  --  4.1  CL  --  108  CO2  --  26  GLUCOSE  --  148*  BUN  --  16  CREATININE  --  0.84  CALCIUM  --  8.5*  AST 23  --   ALT 13  --   ALKPHOS 136*  --   BILITOT 1.3*  --    ------------------------------------------------------------------------------------------------------------------  Cardiac Enzymes Recent Labs  Lab 08/20/18 1540 08/20/18 1823 08/21/18 0016  TROPONINI 0.03*  0.03* 0.03*   ------------------------------------------------------------------------------------------------------------------  RADIOLOGY:  Dg Chest 2 View  Result Date: 08/20/2018 CLINICAL DATA:  LEFT arm swelling, humeral fracture EXAM: CHEST - 2 VIEW COMPARISON:  06/28/2018 FINDINGS: Enlargement of cardiac silhouette. Mediastinal contours and pulmonary vascularity normal. Bibasilar atelectasis. Central peribronchial thickening. Remaining lungs clear. No pleural effusion or pneumothorax. Displaced fracture at surgical neck LEFT humerus without gross dislocation. IMPRESSION: Enlargement of cardiac silhouette with bronchitic changes and bibasilar atelectasis. Displaced fracture at surgical neck of LEFT humerus. Electronically Signed   By: Lavonia Dana M.D.   On: 08/20/2018 16:11     ASSESSMENT AND PLAN:   84 year old female with history of dementia and chronic systolic heart failure ejection fraction 40 to 45% with chronic left bundle branch block who presented due to lower extremity edema and recent fall.  1.  Acute on chronic systolic heart failure: Continue Lasix 20 IV twice daily Monitor intake and output with daily weight BMP for a.m. CHF clinic upon discharge Continue Coreg 2.  Urinary tract infection: Continue Rocephin and follow-up on urine culture  3.  Left humeral fracture after mechanical fall: Orthopedic surgery consultation placed  4.  Essential hypertension: Continue Coreg       CODE STATUS: DNR  TOTAL TIME TAKING CARE OF THIS PATIENT: 30 minutes.     POSSIBLE D/C 2 days, DEPENDING ON CLINICAL CONDITION.   Earley Grobe M.D on 08/21/2018 at 10:13 AM  Between 7am to 6pm - Pager - [phone] After 6pm go to [email or web address] - [place]  [facility]  Office  [phone]  CC: Primary care physician; Adin Hector, MD  Note: This dictation was prepared with Dragon dictation along with smaller phrase technology. Any  transcriptional errors that result from this process are unintentional.

## 2018-08-21 NOTE — Consult Note (Signed)
Cardiology Consultation Note    Patient ID: Krista Phillips, MRN: [id number], DOB/AGE: 82-Aug-1928 82 y.o. Admit date: 08/20/2018   Date of Consult: 08/21/2018 Primary Physician: Adin Hector, MD Primary Cardiologist: Dr. Ubaldo Glassing  Chief Complaint: anasarca Reason for Consultation: anasarca Requesting MD: Dr. Benjie Karvonen  HPI: Krista Phillips is a 84 y.o. female with history of systolic cardiomyopathy as well as a chronic left bundle branch block. Echo done in 2017 has shown an ejection fraction of 40-45% with mild-to-moderate tricuspid regurgitation.  Patient presented to the emergency room with bilateral lower extremity edema as well as relative hypotension.  She had been recently taken off of her Lasix per her report due to hypotension.  This started to occur after she had been taken off of this.  Also reportedly had a recent fall causing a humerus fracture.  She is not been fully evaluated by orthopedics thus far.  She also was noted to have a UTI in the emergency room.  Cardiac markers were unremarkable at 0.03.  She has a difficult historian but has no specific complaints other than shortness of breath.  Her renal function appears stable.  Next x-ray on presentation showed cardiomegaly with bronchitic changes and bibasilar atelectasis with no pulmonary edema.  She had displaced fracture at the surgical neck of the left humerus.  She was hemodynamically stable with a systolic pressure of 633.  Electrocardiogram on presentation revealed probable sinus tachycardia at a rate of 90 with a left bundle branch block with chronic.  Telemetry revealed sinus rhythm/sinus tachycardia with left bundle branch block.  He is currently being treated with carvedilol 3.125 mg twice daily and getting IV Lasix.  She is resting comfortably in a supine position.    Past Medical History:  Diagnosis Date  . B12 deficiency   . Cardiomyopathy ([place])   . CHF (congestive heart failure) ([place])   . Degenerative arthritis   .  Hypertension   . Left bundle branch block (LBBB)       Surgical History:  Past Surgical History:  Procedure Laterality Date  . DILATION AND CURETTAGE OF UTERUS       Home Meds: Prior to Admission medications   Medication Sig Start Date End Date Taking? Authorizing Provider  acetaminophen (TYLENOL) 500 MG tablet Take 500 mg by mouth every 6 (six) hours as needed for mild pain, moderate pain or fever.   Yes [provider]  carvedilol (COREG) 3.125 MG tablet Take 3.125 mg by mouth 2 (two) times daily with a meal.   Yes [provider]  diclofenac sodium (VOLTAREN) 1 % GEL Apply 4 g topically 3 (three) times daily as needed (arthritis pain).   Yes [provider]  guaiFENesin (ROBITUSSIN) 100 MG/5ML SOLN Take 5 mLs (100 mg total) by mouth every 4 (four) hours as needed for cough or to loosen phlegm. 06/30/18  Yes Demetrios Loll, MD  HYDROcodone-acetaminophen (HYCET) 7.5-325 mg/15 ml solution Take 5 mLs by mouth every 6 (six) hours as needed for severe pain. 08/12/18 08/12/19 Yes Merlyn Lot, MD  lidocaine (LIDODERM) 5 % Place 1 patch onto the skin every 12 (twelve) hours. Remove & Discard patch within 12 hours or as directed by MD 08/12/18 08/12/19 Yes Merlyn Lot, MD  nystatin-triamcinolone ointment [facility]) Apply 1 application topically 2 (two) times daily as needed (rash).    Yes [provider]  vitamin B-12 (CYANOCOBALAMIN) 1000 MCG tablet Take 1,000 mcg by mouth daily.   Yes [provider]  Inpatient Medications:  . carvedilol  3.125 mg Oral BID WC  . Chlorhexidine Gluconate Cloth  6 each Topical Q0600  . enoxaparin (LOVENOX) injection  40 mg Subcutaneous Q24H  . furosemide  40 mg Intravenous BID  . mupirocin ointment  1 application Nasal BID   . cefTRIAXone (ROCEPHIN)  IV      Allergies: No Known Allergies  Social History   Socioeconomic History  . Marital status: Married    Spouse name: Not on file  . Number of  children: Not on file  . Years of education: Not on file  . Highest education level: Not on file  Occupational History  . Not on file  Social Needs  . Financial resource strain: Not on file  . Food insecurity:    Worry: Not on file    Inability: Not on file  . Transportation needs:    Medical: Not on file    Non-medical: Not on file  Tobacco Use  . Smoking status: Never Smoker  . Smokeless tobacco: Never Used  Substance and Sexual Activity  . Alcohol use: No  . Drug use: No  . Sexual activity: Not on file  Lifestyle  . Physical activity:    Days per week: Not on file    Minutes per session: Not on file  . Stress: Not on file  Relationships  . Social connections:    Talks on phone: Not on file    Gets together: Not on file    Attends religious service: Not on file    Active member of club or organization: Not on file    Attends meetings of clubs or organizations: Not on file    Relationship status: Not on file  . Intimate partner violence:    Fear of current or ex partner: Not on file    Emotionally abused: Not on file    Physically abused: Not on file    Forced sexual activity: Not on file  Other Topics Concern  . Not on file  Social History Narrative  . Not on file     Family History  Problem Relation Age of Onset  . Ovarian cancer Mother   . Ulcers Father   . Peptic Ulcer Father   . Colon cancer Sister      Review of Systems: A 12-system review of systems was performed and is negative except as noted in the HPI.  Labs: Recent Labs    08/20/18 1540 08/20/18 1823 08/21/18 0016  TROPONINI 0.03* 0.03* 0.03*   Lab Results  Component Value Date   WBC 12.0 (H) 08/21/2018   HGB 10.6 (L) 08/21/2018   HCT 32.9 (L) 08/21/2018   MCV 93.7 08/21/2018   PLT 347 08/21/2018    Recent Labs  Lab 08/20/18 1823 08/21/18 0016  NA  --  140  K  --  4.1  CL  --  108  CO2  --  26  BUN  --  16  CREATININE  --  0.84  CALCIUM  --  8.5*  PROT 6.3*  --   BILITOT  1.3*  --   ALKPHOS 136*  --   ALT 13  --   AST 23  --   GLUCOSE  --  148*   No results found for: CHOL, HDL, LDLCALC, TRIG No results found for: DDIMER  Radiology/Studies:  Dg Chest 2 View  Result Date: 08/20/2018 CLINICAL DATA:  LEFT arm swelling, humeral fracture EXAM: CHEST - 2 VIEW COMPARISON:  06/28/2018 FINDINGS:  Enlargement of cardiac silhouette. Mediastinal contours and pulmonary vascularity normal. Bibasilar atelectasis. Central peribronchial thickening. Remaining lungs clear. No pleural effusion or pneumothorax. Displaced fracture at surgical neck LEFT humerus without gross dislocation. IMPRESSION: Enlargement of cardiac silhouette with bronchitic changes and bibasilar atelectasis. Displaced fracture at surgical neck of LEFT humerus. Electronically Signed   By: Lavonia Dana M.D.   On: 08/20/2018 16:11   Dg Elbow Complete Left  Result Date: 08/12/2018 CLINICAL DATA:  Fall in shower. Skin tear over the elbow along with soreness. EXAM: LEFT ELBOW - COMPLETE 3+ VIEW COMPARISON:  None. FINDINGS: There is spurring and irregularity of the radial head without obvious cortical discontinuity. Supinator fat pad effaced/indistinct. Mildly effaced anterior fat pad without visible posterior fat pad. Bony spur like structure anteriorly along the distal humeral metaphysis resembling a supracondylar process but more distal than typically encountered in supracondylar process. The anterior humeral line intersects the middle third of the capitellum. There is mild spurring of the ulna at the proximal radioulnar articulation. IMPRESSION: 1. Irregular radial head probably from spurring, less likely fracture. Elbow joint effusion with effacement of the anterior fat pad. Unusual anterior spur along the distal anterior humerus, resembling supracondylar process but more distal than is usually encountered. Indistinct/effaced supinator fat pad. Given the constellation of potential abnormalities and the possibility of  subtle underlying fracture, CT of the elbow is recommended further characterization. Electronically Signed   By: Van Clines M.D.   On: 08/12/2018 07:54   Dg Forearm Left  Result Date: 08/12/2018 CLINICAL DATA:  Fall, pain in the elbow. EXAM: LEFT FOREARM - 2 VIEW COMPARISON:  None. FINDINGS: Bony demineralization. Spurring along the distal humeral epicondyles, along the coronoid process, and along the radial head. Suspected elbow joint effusion. Supracondylar process. No fracture of the shaft of the radius or ulna identified. Mildly irregular ulnar styloid, query old injury. Bony demineralization. Loss of articular space laterally in the carpus. IMPRESSION: 1. Elbow joint effusion with notable spurring along the elbow. Please see dedicated elbow report. 2. Bony demineralization. 3. Degenerative findings in the lateral carpus. 4. Mildly irregular ulnar styloid, query old injury. Electronically Signed   By: Van Clines M.D.   On: 08/12/2018 07:56   Ct Head Wo Contrast  Addendum Date: 08/12/2018   ADDENDUM REPORT: 08/12/2018 11:00 ADDENDUM: Unchanged 6 mm extra-axial mass over the right frontal convexity compatible with a meningioma. Electronically Signed   By: Logan Bores M.D.   On: 08/12/2018 11:00   Result Date: 08/12/2018 CLINICAL DATA:  Fall in shower. No loss of consciousness. Initial encounter. EXAM: CT HEAD WITHOUT CONTRAST CT CERVICAL SPINE WITHOUT CONTRAST TECHNIQUE: Multidetector CT imaging of the head and cervical spine was performed following the standard protocol without intravenous contrast. Multiplanar CT image reconstructions of the cervical spine were also generated. COMPARISON:  Head CT 06/28/2018 FINDINGS: CT HEAD FINDINGS Brain: There is no evidence of acute infarct, intracranial hemorrhage, mass, midline shift, or extra-axial fluid collection. Generalized cerebral atrophy is mild for age. Patchy to confluent cerebral white matter hypodensities are unchanged and  nonspecific but compatible with moderately advanced chronic small vessel ischemic disease. Vascular: Calcified atherosclerosis at the skull base. No hyperdense vessel. Skull: No fracture or suspicious osseous lesion. Sinuses/Orbits: Mild right maxillary sinus mucosal thickening. Clear mastoid air cells. Unremarkable orbits. Other: None. CT CERVICAL SPINE FINDINGS Alignment: Normal. Skull base and vertebrae: No acute fracture or destructive osseous process. Mild C1-2 arthropathy. Soft tissues and spinal canal: No prevertebral fluid or swelling. No visible  canal hematoma. Disc levels: Moderate multilevel facet arthrosis predominantly in the mid cervical spine. Mild disc degeneration greatest at C4-5. No osseous spinal stenosis. Mild neural foraminal stenosis bilaterally at C4-5. Upper chest: No apical lung consolidation or mass. Other: Mild carotid artery atherosclerosis. IMPRESSION: 1. No evidence of acute intracranial abnormality. 2. Moderate chronic small vessel ischemic disease. 3. No evidence of acute cervical spine fracture or subluxation. Electronically Signed: By: Logan Bores M.D. On: 08/12/2018 10:54   Ct Cervical Spine Wo Contrast  Addendum Date: 08/12/2018   ADDENDUM REPORT: 08/12/2018 11:00 ADDENDUM: Unchanged 6 mm extra-axial mass over the right frontal convexity compatible with a meningioma. Electronically Signed   By: Logan Bores M.D.   On: 08/12/2018 11:00   Result Date: 08/12/2018 CLINICAL DATA:  Fall in shower. No loss of consciousness. Initial encounter. EXAM: CT HEAD WITHOUT CONTRAST CT CERVICAL SPINE WITHOUT CONTRAST TECHNIQUE: Multidetector CT imaging of the head and cervical spine was performed following the standard protocol without intravenous contrast. Multiplanar CT image reconstructions of the cervical spine were also generated. COMPARISON:  Head CT 06/28/2018 FINDINGS: CT HEAD FINDINGS Brain: There is no evidence of acute infarct, intracranial hemorrhage, mass, midline shift, or  extra-axial fluid collection. Generalized cerebral atrophy is mild for age. Patchy to confluent cerebral white matter hypodensities are unchanged and nonspecific but compatible with moderately advanced chronic small vessel ischemic disease. Vascular: Calcified atherosclerosis at the skull base. No hyperdense vessel. Skull: No fracture or suspicious osseous lesion. Sinuses/Orbits: Mild right maxillary sinus mucosal thickening. Clear mastoid air cells. Unremarkable orbits. Other: None. CT CERVICAL SPINE FINDINGS Alignment: Normal. Skull base and vertebrae: No acute fracture or destructive osseous process. Mild C1-2 arthropathy. Soft tissues and spinal canal: No prevertebral fluid or swelling. No visible canal hematoma. Disc levels: Moderate multilevel facet arthrosis predominantly in the mid cervical spine. Mild disc degeneration greatest at C4-5. No osseous spinal stenosis. Mild neural foraminal stenosis bilaterally at C4-5. Upper chest: No apical lung consolidation or mass. Other: Mild carotid artery atherosclerosis. IMPRESSION: 1. No evidence of acute intracranial abnormality. 2. Moderate chronic small vessel ischemic disease. 3. No evidence of acute cervical spine fracture or subluxation. Electronically Signed: By: Logan Bores M.D. On: 08/12/2018 10:54   Ct Elbow Left Wo Contrast  Result Date: 08/12/2018 CLINICAL DATA:  [place] in shower and injured elbow. EXAM: CT OF THE UPPER LEFT EXTREMITY WITHOUT CONTRAST TECHNIQUE: Multidetector CT imaging of the upper left extremity was performed according to the standard protocol. COMPARISON:  Radiographs 08/12/2018 FINDINGS: The joint spaces are fairly well maintained. There are mild degenerative changes. No acute fracture, osteochondral lesion or joint effusion. IMPRESSION: 1. Mild elbow joint degenerative changes. 2. No acute elbow fracture, osteochondral lesion or joint effusion. Electronically Signed   By: Marijo Sanes M.D.   On: 08/12/2018 08:51   Dg Shoulder  Left  Result Date: 08/12/2018 CLINICAL DATA:  Left shoulder injury in a fall this morning. Initial encounter. EXAM: LEFT SHOULDER - 2+ VIEW COMPARISON:  PA and lateral chest 06/28/2018 FINDINGS: The patient has an acute transverse fracture of the surgical neck of the left humerus with 1 shaft width anterior displacement of the diaphysis of the humerus. There is also 1/2 shaft width medial displacement. The acromioclavicular joint is intact with mild degenerative change noted. IMPRESSION: Acute, displaced surgical neck fracture left humerus. Electronically Signed   By: Inge Rise M.D.   On: 08/12/2018 09:41    Wt Readings from Last 3 Encounters:  08/20/18 65 kg  08/12/18  65.3 kg  06/28/18 58 kg    EKG: Sinus rhythm with left bundle branch block  Physical Exam: Somewhat somnolent female which does not appear to be in acute distress. Blood pressure 123/65, pulse (!) 109, temperature 98.2 F (36.8 C), temperature source Oral, resp. rate 18, height 5\' 2"  (1.575 m), weight 65 kg, SpO2 98 %. Body mass index is 26.21 kg/m. General: Well developed, well nourished, in no acute distress. Head: Normocephalic, atraumatic, sclera non-icteric, no xanthomas, nares are without discharge.  Neck: Negative for carotid bruits. JVD not elevated. Lungs: Clear bilaterally to auscultation without wheezes, rales, or rhonchi. Breathing is unlabored. Heart: RRR with S1 S2. No murmurs, rubs, or gallops appreciated. Abdomen: Soft, non-tender, non-distended with normoactive bowel sounds. No hepatomegaly. No rebound/guarding. No obvious abdominal masses. Msk:  Strength and tone appear normal for age. Extremities: No clubbing or cyanosis.  2+ edema in lower extremities bilaterally.  Distal pedal pulses are 2+ and equal bilaterally. Neuro: Somewhat somnolent.     Assessment and Plan  84 year old female with history of mild cardiomyopathy and ejection fraction of 40 to 45% with a chronic left bundle branch block  admitted with lower extremity edema.  She had been taken off of furosemide due to hypotension as an outpatient.  Her lower extremity edema began occurring apparently after this was discontinued.  Chest x-ray showed no high-grade pulmonary edema.  She is now back on Lasix IV.  Would continue to carefully diurese following renal function and hemodynamics.  Orthopedics consult is pending regarding her humeral fracture.  We will continue to follow with you.  Does not appear to require any other diagnostic cardiac studies at present depending on orthopedic plans.  Signed, Teodoro Spray MD 08/21/2018, 7:58 AM Pager: [phone]

## 2018-08-21 NOTE — Consult Note (Signed)
ORTHOPAEDIC CONSULTATION  REQUESTING PHYSICIAN: Krista Costa, MD  Chief Complaint:   L shoulder pain  History of Present Illness: Patient has significant dementia and unable to provide history.  History obtained from review of chart and prior records.  Krista Phillips is a 84 y.o. female who had a fall on 08/12/2018.  She presented to the emergency department and was diagnosed with a proximal humerus fracture.  Dr. Rudene Phillips was consulted at that time and nonoperative management was recommended.  The patient Did not have any orthopedic follow-up.  She was evaluated by her primary care doctor, Dr. Caryl Phillips and immediately transferred to the emergency department given significant fluid overload/anasarca.    I was consulted regarding further management given lack of specific orthopedic management.  Past Medical History:  Diagnosis Date  . B12 deficiency   . Cardiomyopathy ([place])   . CHF (congestive heart failure) ([place])   . Degenerative arthritis   . Hypertension   . Left bundle branch block (LBBB)    Past Surgical History:  Procedure Laterality Date  . DILATION AND CURETTAGE OF UTERUS     Social History   Socioeconomic History  . Marital status: Married    Spouse name: Not on file  . Number of children: Not on file  . Years of education: Not on file  . Highest education level: Not on file  Occupational History  . Not on file  Social Needs  . Financial resource strain: Not on file  . Food insecurity:    Worry: Not on file    Inability: Not on file  . Transportation needs:    Medical: Not on file    Non-medical: Not on file  Tobacco Use  . Smoking status: Never Smoker  . Smokeless tobacco: Never Used  Substance and Sexual Activity  . Alcohol use: No  . Drug use: No  . Sexual activity: Not on file  Lifestyle  . Physical activity:    Days per week: Not on file    Minutes per session: Not on file  . Stress: Not on file   Relationships  . Social connections:    Talks on phone: Not on file    Gets together: Not on file    Attends religious service: Not on file    Active member of club or organization: Not on file    Attends meetings of clubs or organizations: Not on file    Relationship status: Not on file  Other Topics Concern  . Not on file  Social History Narrative  . Not on file   Family History  Problem Relation Age of Onset  . Ovarian cancer Mother   . Ulcers Father   . Peptic Ulcer Father   . Colon cancer Sister    No Known Allergies Prior to Admission medications   Medication Sig Start Date End Date Taking? Authorizing Provider  acetaminophen (TYLENOL) 500 MG tablet Take 500 mg by mouth every 6 (six) hours as needed for mild pain, moderate pain or fever.   Yes [provider]  carvedilol (COREG) 3.125 MG tablet Take 3.125 mg by mouth 2 (two) times daily with a meal.   Yes [provider]  diclofenac sodium (VOLTAREN) 1 % GEL Apply 4 g topically 3 (three) times daily as needed (arthritis pain).   Yes [provider]  guaiFENesin (ROBITUSSIN) 100 MG/5ML SOLN Take 5 mLs (100 mg total) by mouth every 4 (four) hours as needed for cough or to loosen phlegm. 06/30/18  Yes Krista Phillips,  Sheppard Evens, MD  HYDROcodone-acetaminophen (HYCET) 7.5-325 mg/15 ml solution Take 5 mLs by mouth every 6 (six) hours as needed for severe pain. 08/12/18 08/12/19 Yes Krista Lot, MD  lidocaine (LIDODERM) 5 % Place 1 patch onto the skin every 12 (twelve) hours. Remove & Discard patch within 12 hours or as directed by MD 08/12/18 08/12/19 Yes Krista Lot, MD  nystatin-triamcinolone ointment [facility]) Apply 1 application topically 2 (two) times daily as needed (rash).    Yes [provider]  vitamin B-12 (CYANOCOBALAMIN) 1000 MCG tablet Take 1,000 mcg by mouth daily.   Yes [provider]   Recent Labs    08/20/18 1540 08/21/18 0016  WBC 12.4* 12.0*  HGB 11.3* 10.6*  HCT 35.6*  32.9*  PLT 326 347  K 4.1 4.1  CL 106 108  CO2 23 26  BUN 17 16  CREATININE 0.70 0.84  GLUCOSE 172* 148*  CALCIUM 8.6* 8.5*   Dg Chest 2 View  Result Date: 08/20/2018 CLINICAL DATA:  LEFT arm swelling, humeral fracture EXAM: CHEST - 2 VIEW COMPARISON:  06/28/2018 FINDINGS: Enlargement of cardiac silhouette. Mediastinal contours and pulmonary vascularity normal. Bibasilar atelectasis. Central peribronchial thickening. Remaining lungs clear. No pleural effusion or pneumothorax. Displaced fracture at surgical neck LEFT humerus without gross dislocation. IMPRESSION: Enlargement of cardiac silhouette with bronchitic changes and bibasilar atelectasis. Displaced fracture at surgical neck of LEFT humerus. Electronically Signed   By: Krista Phillips M.D.   On: 08/20/2018 16:11     Positive ROS: All other systems have been reviewed and were otherwise negative with the exception of those mentioned in the HPI and as above.  Physical Exam: BP 117/60 (BP Location: Left Arm)   Pulse (!) 103   Temp 99.3 F (37.4 C) (Oral)   Resp 18   Ht 5\' 2"  (1.575 m)   Wt 65 kg   SpO2 99%   BMI 26.21 kg/m  General:  Alert, no acute distress Psychiatric:  Patient is competent for consent with normal mood and affect   Cardiovascular:  No pedal edema, regular rate and rhythm Respiratory:  No wheezing, non-labored breathing GI:  Abdomen is soft and non-tender Skin:  No lesions in the area of chief complaint, no erythema Neurologic:  Sensation intact distally, CN grossly intact Lymphatic:  No axillary or cervical lymphadenopathy  Orthopedic Exam:  LLE: + DF/PF/EHL SILT grossly over foot Foot wwp +Log roll/axial load   X-rays:  As above: L proximal humerus fracture with significant displacement.  Assessment/Plan: Krista Phillips is a 84 y.o. female with a L proximal humerus fracture with significant displacement   1.  Given the patient's current mental status, baseline function, and medical comorbidities, I  do not think surgical management of her proximal humerus fracture would be beneficial and likely would cause more harm.  Nonoperative management would likely lead to the best long-term result, although she would have significant limitations in regards to her function of her left shoulder.  2.  Nonweightbearing on left upper extremity.  Keep arm in sling.  3.  Please page with any questions.  She can follow-up as an outpatient.       Leim Fabry   08/21/2018 12:08 PM

## 2018-08-22 LAB — BASIC METABOLIC PANEL
Anion gap: 7 (ref 5–15)
BUN: 21 mg/dL (ref 8–23)
CO2: 27 mmol/L (ref 22–32)
Calcium: 8.1 mg/dL — ABNORMAL LOW (ref 8.9–10.3)
Chloride: 105 mmol/L (ref 98–111)
Creatinine, Ser: 0.8 mg/dL (ref 0.44–1.00)
GFR calc Af Amer: >60 mL/min (ref >60)
Glucose, Bld: 108 mg/dL — ABNORMAL HIGH (ref 70–99)
Potassium: 3.3 mmol/L — ABNORMAL LOW (ref 3.5–5.1)
SODIUM: 139 mmol/L (ref 135–145)

## 2018-08-22 MED ORDER — SODIUM CHLORIDE 0.9 % IV SOLN: 1.0000 g | INTRAVENOUS | Status: DC

## 2018-08-22 MED ORDER — FUROSEMIDE 40 MG PO TABS
40.0000 mg | ORAL_TABLET | Freq: Every day | ORAL | Status: DC
  Administered 2018-08-23 – 2018-08-24 (×2): 40 mg via ORAL
  Filled 2018-08-22 (×2): qty 1

## 2018-08-22 NOTE — NC FL2 (Signed)
[place] LEVEL OF CARE SCREENING TOOL     IDENTIFICATION  Patient Name: Krista Phillips Birthdate: [birth year] Sex: female Admission Date (Current Location): 08/20/2018  [place] and [place] Number:  Krista Phillips [id number] [place] and Address:  [facility], [address], [place], [place] [address]      Provider Number: [id number]  Attending Physician Name and Address:  [name], MD  Relative Name and Phone Number:  Lesle [facility]) [phone] or [name] (Sister) [phone]    Current Level of Care: Hospital Recommended Level of Care: [place] Prior Approval Number:    Date Approved/Denied:   PASRR Number: [id number]  Discharge Plan: SNF    Current Diagnoses: Patient Active Problem List   Diagnosis Date Noted  . Anasarca 08/20/2018  . Chronic systolic CHF (congestive heart failure) ([place]) 08/20/2018  . Humerus fracture 08/20/2018  . HTN (hypertension) 08/20/2018  . UTI (urinary tract infection) 08/20/2018  . Sepsis ([place]) 06/28/2018    Orientation RESPIRATION BLADDER Height & Weight     Self, Place  Normal Incontinent Weight: 131 lb 11.2 oz (59.7 kg) Height:  5\' 2"  (157.5 cm)  BEHAVIORAL SYMPTOMS/MOOD NEUROLOGICAL BOWEL NUTRITION STATUS      Incontinent Diet(Soft)  AMBULATORY STATUS COMMUNICATION OF NEEDS Skin   Extensive Assist Verbally Normal                       Personal Care Assistance Level of Assistance  Bathing, Dressing, Feeding Bathing Assistance: Limited assistance Feeding assistance: Independent Dressing Assistance: Limited assistance     Functional Limitations Info  Sight, Hearing, Speech Sight Info: Adequate Hearing Info: Adequate Speech Info: Adequate    SPECIAL CARE FACTORS FREQUENCY  PT (By licensed PT), OT (By licensed OT)     PT Frequency: Up to 5X per week OT Frequency: Up to 3X per week            Contractures Contractures Info: Not present     Additional Factors Info  Code Status, Allergies, Isolation Precautions Code Status Info: DNR Allergies Info: No Known Allergies     Isolation Precautions Info: Contact precautions for MRSA     Current Medications (08/22/2018):  This is the current hospital active medication list Current Facility-Administered Medications  Medication Dose Route Frequency Provider Last Rate Last Dose  . acetaminophen (TYLENOL) tablet 650 mg  650 mg Oral Q6H PRN Lance Coon, MD       Or  . acetaminophen (TYLENOL) suppository 650 mg  650 mg Rectal Q6H PRN Lance Coon, MD      . carvedilol (COREG) tablet 3.125 mg  3.125 mg Oral BID WC Lance Coon, MD   3.125 mg at 08/22/18 0933  . cefTRIAXone (ROCEPHIN) 1 g in sodium chloride 0.9 % 100 mL IVPB  1 g Intravenous Q24H Lance Coon, MD 200 mL/hr at 08/21/18 2105 1 g at 08/21/18 2105  . Chlorhexidine Gluconate Cloth 2 % PADS 6 each  6 each Topical Q0600 Lance Coon, MD   6 each at 08/21/18 [phone]  . enoxaparin (LOVENOX) injection 40 mg  40 mg Subcutaneous Q24H Lance Coon, MD   40 mg at 08/21/18 2102  . furosemide (LASIX) tablet 40 mg  40 mg Oral Daily Teodoro Spray, MD      . morphine 2 MG/ML injection 2 mg  2 mg Intravenous Q4H PRN Lance Coon, MD      . mupirocin ointment (BACTROBAN) 2 % 1 application  1 application Nasal BID  Lance Coon, MD   1 application at 95/07/22 0940  . ondansetron (ZOFRAN) tablet 4 mg  4 mg Oral Q6H PRN Lance Coon, MD       Or  . ondansetron [facility]) injection 4 mg  4 mg Intravenous Q6H PRN Lance Coon, MD      . oxyCODONE (Oxy IR/ROXICODONE) immediate release tablet 5 mg  5 mg Oral Q4H PRN Lance Coon, MD   5 mg at 08/22/18 5750     Discharge Medications: Please see discharge summary for a list of discharge medications.  Relevant Imaging Results:  Relevant Lab Results:   Additional Information [phone]  Zettie Pho, LCSW

## 2018-08-22 NOTE — Progress Notes (Signed)
Patient Name: Krista Phillips Date of Encounter: 08/22/2018  Hospital Problem List     Principal Problem:   Anasarca Active Problems:   Chronic systolic CHF (congestive heart failure) (HCC)   Humerus fracture   HTN (hypertension)   UTI (urinary tract infection)    Patient Profile     84 y.o. female with history of systolic cardiomyopathy as well as a chronic left bundle branch block. Echo done in 2017 has shown an ejection fraction of 40-45% with mild-to-moderate tricuspid regurgitation.  Patient presented emergency room with bilateral lower extremity edema and hypotension.  Chest x-ray revealed no significant pulmonary edema.  She is a difficult historian.  She is ruled out for myocardial infarction.  Serum potassium was 3.3 today.  Renal function is stable.  And left humeral fracture  Subjective   Responds slowly to questions.  Has no cardiac complaints.  Inpatient Medications    . carvedilol  3.125 mg Oral BID WC  . Chlorhexidine Gluconate Cloth  6 each Topical Q0600  . enoxaparin (LOVENOX) injection  40 mg Subcutaneous Q24H  . furosemide  20 mg Intravenous BID  . mupirocin ointment  1 application Nasal BID    Vital Signs    Vitals:   08/21/18 1715 08/21/18 2025 08/22/18 0434 08/22/18 0823  BP: (!) 110/55 (!) 113/58 (!) 110/58 (!) 114/52  Pulse: 88 86 (!) 103 96  Resp: 18   18  Temp: 98.1 F (36.7 C) 98 F (36.7 C) 98.7 F (37.1 C) 97.7 F (36.5 C)  TempSrc: Oral Oral Oral Oral  SpO2: 98% 99% 95% 93%  Weight:   59.7 kg   Height:        Intake/Output Summary (Last 24 hours) at 08/22/2018 1032 Last data filed at 08/21/2018 1806 Gross per 24 hour  Intake 0 ml  Output 1600 ml  Net -1600 ml   Filed Weights   08/20/18 1537 08/22/18 0434  Weight: 65 kg 59.7 kg    Physical Exam    GEN: Elderly Caucasian female in no acute distress. HEENT: normal.  Neck: Supple, no JVD, carotid bruits, or masses. Cardiac: RRR, no murmurs, rubs, or gallops.  2+ edema in  lower extremities bilaterally.  Radials/DP/PT 2+ and equal bilaterally.  Respiratory:  Respirations regular and unlabored, clear to auscultation bilaterally. GI: Soft, nontender, nondistended, BS + x 4. MS: History of left humeral fracture Skin: warm and dry, no rash. Neuro:  Strength and sensation are intact. Psych: Normal affect.  Labs    CBC Recent Labs    08/20/18 1540 08/21/18 0016  WBC 12.4* 12.0*  HGB 11.3* 10.6*  HCT 35.6* 32.9*  MCV 96.0 93.7  PLT 326 809   Basic Metabolic Panel Recent Labs    08/21/18 0016 08/22/18 0352  NA 140 139  K 4.1 3.3*  CL 108 105  CO2 26 27  GLUCOSE 148* 108*  BUN 16 21  CREATININE 0.84 0.80  CALCIUM 8.5* 8.1*   Liver Function Tests Recent Labs    08/20/18 1823  AST 23  ALT 13  ALKPHOS 136*  BILITOT 1.3*  PROT 6.3*  ALBUMIN 3.0*   No results for input(s): LIPASE, AMYLASE in the last 72 hours. Cardiac Enzymes Recent Labs    08/20/18 1540 08/20/18 1823 08/21/18 0016  TROPONINI 0.03* 0.03* 0.03*   BNP Recent Labs    08/20/18 1823  BNP 84.0   D-Dimer No results for input(s): DDIMER in the last 72 hours. Hemoglobin A1C No results for input(s): HGBA1C  in the last 72 hours. Fasting Lipid Panel No results for input(s): CHOL, HDL, LDLCALC, TRIG, CHOLHDL, LDLDIRECT in the last 72 hours. Thyroid Function Tests No results for input(s): TSH, T4TOTAL, T3FREE, THYROIDAB in the last 72 hours.  Invalid input(s): FREET3  Telemetry    Sinus rhythm at a rate of 98.  Left bundle branch block  ECG  Sinus rhythm left bundle branch block  Radiology    Dg Chest 2 View  Result Date: 08/20/2018 CLINICAL DATA:  LEFT arm swelling, humeral fracture EXAM: CHEST - 2 VIEW COMPARISON:  06/28/2018 FINDINGS: Enlargement of cardiac silhouette. Mediastinal contours and pulmonary vascularity normal. Bibasilar atelectasis. Central peribronchial thickening. Remaining lungs clear. No pleural effusion or pneumothorax. Displaced fracture at  surgical neck LEFT humerus without gross dislocation. IMPRESSION: Enlargement of cardiac silhouette with bronchitic changes and bibasilar atelectasis. Displaced fracture at surgical neck of LEFT humerus. Electronically Signed   By: Lavonia Dana M.D.   On: 08/20/2018 16:11   Dg Elbow Complete Left  Result Date: 08/12/2018 CLINICAL DATA:  Fall in shower. Skin tear over the elbow along with soreness. EXAM: LEFT ELBOW - COMPLETE 3+ VIEW COMPARISON:  None. FINDINGS: There is spurring and irregularity of the radial head without obvious cortical discontinuity. Supinator fat pad effaced/indistinct. Mildly effaced anterior fat pad without visible posterior fat pad. Bony spur like structure anteriorly along the distal humeral metaphysis resembling a supracondylar process but more distal than typically encountered in supracondylar process. The anterior humeral line intersects the middle third of the capitellum. There is mild spurring of the ulna at the proximal radioulnar articulation. IMPRESSION: 1. Irregular radial head probably from spurring, less likely fracture. Elbow joint effusion with effacement of the anterior fat pad. Unusual anterior spur along the distal anterior humerus, resembling supracondylar process but more distal than is usually encountered. Indistinct/effaced supinator fat pad. Given the constellation of potential abnormalities and the possibility of subtle underlying fracture, CT of the elbow is recommended further characterization. Electronically Signed   By: Van Clines M.D.   On: 08/12/2018 07:54   Dg Forearm Left  Result Date: 08/12/2018 CLINICAL DATA:  Fall, pain in the elbow. EXAM: LEFT FOREARM - 2 VIEW COMPARISON:  None. FINDINGS: Bony demineralization. Spurring along the distal humeral epicondyles, along the coronoid process, and along the radial head. Suspected elbow joint effusion. Supracondylar process. No fracture of the shaft of the radius or ulna identified. Mildly irregular  ulnar styloid, query old injury. Bony demineralization. Loss of articular space laterally in the carpus. IMPRESSION: 1. Elbow joint effusion with notable spurring along the elbow. Please see dedicated elbow report. 2. Bony demineralization. 3. Degenerative findings in the lateral carpus. 4. Mildly irregular ulnar styloid, query old injury. Electronically Signed   By: Van Clines M.D.   On: 08/12/2018 07:56   Ct Head Wo Contrast  Addendum Date: 08/12/2018   ADDENDUM REPORT: 08/12/2018 11:00 ADDENDUM: Unchanged 6 mm extra-axial mass over the right frontal convexity compatible with a meningioma. Electronically Signed   By: Logan Bores M.D.   On: 08/12/2018 11:00   Result Date: 08/12/2018 CLINICAL DATA:  Fall in shower. No loss of consciousness. Initial encounter. EXAM: CT HEAD WITHOUT CONTRAST CT CERVICAL SPINE WITHOUT CONTRAST TECHNIQUE: Multidetector CT imaging of the head and cervical spine was performed following the standard protocol without intravenous contrast. Multiplanar CT image reconstructions of the cervical spine were also generated. COMPARISON:  Head CT 06/28/2018 FINDINGS: CT HEAD FINDINGS Brain: There is no evidence of acute infarct, intracranial hemorrhage,  mass, midline shift, or extra-axial fluid collection. Generalized cerebral atrophy is mild for age. Patchy to confluent cerebral white matter hypodensities are unchanged and nonspecific but compatible with moderately advanced chronic small vessel ischemic disease. Vascular: Calcified atherosclerosis at the skull base. No hyperdense vessel. Skull: No fracture or suspicious osseous lesion. Sinuses/Orbits: Mild right maxillary sinus mucosal thickening. Clear mastoid air cells. Unremarkable orbits. Other: None. CT CERVICAL SPINE FINDINGS Alignment: Normal. Skull base and vertebrae: No acute fracture or destructive osseous process. Mild C1-2 arthropathy. Soft tissues and spinal canal: No prevertebral fluid or swelling. No visible canal  hematoma. Disc levels: Moderate multilevel facet arthrosis predominantly in the mid cervical spine. Mild disc degeneration greatest at C4-5. No osseous spinal stenosis. Mild neural foraminal stenosis bilaterally at C4-5. Upper chest: No apical lung consolidation or mass. Other: Mild carotid artery atherosclerosis. IMPRESSION: 1. No evidence of acute intracranial abnormality. 2. Moderate chronic small vessel ischemic disease. 3. No evidence of acute cervical spine fracture or subluxation. Electronically Signed: By: Logan Bores M.D. On: 08/12/2018 10:54   Ct Cervical Spine Wo Contrast  Addendum Date: 08/12/2018   ADDENDUM REPORT: 08/12/2018 11:00 ADDENDUM: Unchanged 6 mm extra-axial mass over the right frontal convexity compatible with a meningioma. Electronically Signed   By: Logan Bores M.D.   On: 08/12/2018 11:00   Result Date: 08/12/2018 CLINICAL DATA:  Fall in shower. No loss of consciousness. Initial encounter. EXAM: CT HEAD WITHOUT CONTRAST CT CERVICAL SPINE WITHOUT CONTRAST TECHNIQUE: Multidetector CT imaging of the head and cervical spine was performed following the standard protocol without intravenous contrast. Multiplanar CT image reconstructions of the cervical spine were also generated. COMPARISON:  Head CT 06/28/2018 FINDINGS: CT HEAD FINDINGS Brain: There is no evidence of acute infarct, intracranial hemorrhage, mass, midline shift, or extra-axial fluid collection. Generalized cerebral atrophy is mild for age. Patchy to confluent cerebral white matter hypodensities are unchanged and nonspecific but compatible with moderately advanced chronic small vessel ischemic disease. Vascular: Calcified atherosclerosis at the skull base. No hyperdense vessel. Skull: No fracture or suspicious osseous lesion. Sinuses/Orbits: Mild right maxillary sinus mucosal thickening. Clear mastoid air cells. Unremarkable orbits. Other: None. CT CERVICAL SPINE FINDINGS Alignment: Normal. Skull base and vertebrae: No acute  fracture or destructive osseous process. Mild C1-2 arthropathy. Soft tissues and spinal canal: No prevertebral fluid or swelling. No visible canal hematoma. Disc levels: Moderate multilevel facet arthrosis predominantly in the mid cervical spine. Mild disc degeneration greatest at C4-5. No osseous spinal stenosis. Mild neural foraminal stenosis bilaterally at C4-5. Upper chest: No apical lung consolidation or mass. Other: Mild carotid artery atherosclerosis. IMPRESSION: 1. No evidence of acute intracranial abnormality. 2. Moderate chronic small vessel ischemic disease. 3. No evidence of acute cervical spine fracture or subluxation. Electronically Signed: By: Logan Bores M.D. On: 08/12/2018 10:54   Ct Elbow Left Wo Contrast  Result Date: 08/12/2018 CLINICAL DATA:  [place] in shower and injured elbow. EXAM: CT OF THE UPPER LEFT EXTREMITY WITHOUT CONTRAST TECHNIQUE: Multidetector CT imaging of the upper left extremity was performed according to the standard protocol. COMPARISON:  Radiographs 08/12/2018 FINDINGS: The joint spaces are fairly well maintained. There are mild degenerative changes. No acute fracture, osteochondral lesion or joint effusion. IMPRESSION: 1. Mild elbow joint degenerative changes. 2. No acute elbow fracture, osteochondral lesion or joint effusion. Electronically Signed   By: Marijo Sanes M.D.   On: 08/12/2018 08:51   Dg Shoulder Left  Result Date: 08/12/2018 CLINICAL DATA:  Left shoulder injury in a fall this  morning. Initial encounter. EXAM: LEFT SHOULDER - 2+ VIEW COMPARISON:  PA and lateral chest 06/28/2018 FINDINGS: The patient has an acute transverse fracture of the surgical neck of the left humerus with 1 shaft width anterior displacement of the diaphysis of the humerus. There is also 1/2 shaft width medial displacement. The acromioclavicular joint is intact with mild degenerative change noted. IMPRESSION: Acute, displaced surgical neck fracture left humerus. Electronically Signed    By: Inge Rise M.D.   On: 08/12/2018 09:41    Assessment & Plan    84 year old female with history of EF of 45% with chronic left bundle branch block admitted with lower extremity edema and a humeral fracture.  Her edema has improved since resuming diuresis.  Appears hemodynamically stable at present.  Remains in sinus rhythm.  Renal function is stable.  We will continue with current cardiac treatment including carvedilol 3.125 mg twice daily, furosemide although will switch to p.o. at 40 mg every morning.  Signed, Javier Docker Simcha Speir MD 08/22/2018, 10:32 AM  Pager: [phone]

## 2018-08-22 NOTE — Plan of Care (Signed)
  Problem: Education: Goal: Knowledge of General Education information will improve Description: Including pain rating scale, medication(s)/side effects and non-pharmacologic comfort measures Outcome: Progressing   Problem: Safety: Goal: Ability to remain free from injury will improve Outcome: Progressing   Problem: Skin Integrity: Goal: Risk for impaired skin integrity will decrease Outcome: Progressing   

## 2018-08-22 NOTE — Progress Notes (Signed)
[place] [facility] [place] NAME: Krista Phillips    MR#:  [id number]  DATE OF BIRTH:  1926/10/19  SUBJECTIVE:  Patient doing fine this am No acute events overnight.  Family at bedside.  Patient has dementia. REVIEW OF SYSTEMS:  Unable to obtain: Dementia   Tolerating Diet: yes      DRUG ALLERGIES:  No Known Allergies  VITALS:  Blood pressure (!) 114/52, pulse 96, temperature 97.7 F (36.5 C), temperature source Oral, resp. rate 18, height 5\' 2"  (1.575 m), weight 59.7 kg, SpO2 93 %.  PHYSICAL EXAMINATION:  Constitutional: Appears well-developed and well-nourished. No distress. HENT: Normocephalic. Marland Kitchen Oropharynx is clear and moist.  Eyes: Conjunctivae are normal. no scleral icterus.  Neck: Normal ROM. Neck supple. No JVD. No tracheal deviation. CVS: RRR, S1/S2 +, no murmurs, no gallops, no carotid bruit.  Pulmonary: Effort and breath sounds normal, no stridor, rhonchi, wheezes, rales.  Abdominal: Soft. BS +,  no distension, tenderness, rebound or guarding.  Musculoskeletal: 2+ lower extremity edema and no tenderness.  Does not move left upper extremity Neuro: No focal deficit  skin: Skin is warm and dry. No rash noted. Bruising noted on neck Psychiatric: Pleasantly confused   LABORATORY PANEL:   CBC Recent Labs  Lab 08/21/18 0016  WBC 12.0*  HGB 10.6*  HCT 32.9*  PLT 347   ------------------------------------------------------------------------------------------------------------------  Chemistries  Recent Labs  Lab 08/20/18 1823  08/22/18 0352  NA  --    < > 139  K  --    < > 3.3*  CL  --    < > 105  CO2  --    < > 27  GLUCOSE  --    < > 108*  BUN  --    < > 21  CREATININE  --    < > 0.80  CALCIUM  --    < > 8.1*  AST 23  --   --   ALT 13  --   --   ALKPHOS 136*  --   --   BILITOT 1.3*  --   --    < > = values in this interval not displayed.    ------------------------------------------------------------------------------------------------------------------  Cardiac Enzymes Recent Labs  Lab 08/20/18 1540 08/20/18 1823 08/21/18 0016  TROPONINI 0.03* 0.03* 0.03*   ------------------------------------------------------------------------------------------------------------------  RADIOLOGY:  Dg Chest 2 View  Result Date: 08/20/2018 CLINICAL DATA:  LEFT arm swelling, humeral fracture EXAM: CHEST - 2 VIEW COMPARISON:  06/28/2018 FINDINGS: Enlargement of cardiac silhouette. Mediastinal contours and pulmonary vascularity normal. Bibasilar atelectasis. Central peribronchial thickening. Remaining lungs clear. No pleural effusion or pneumothorax. Displaced fracture at surgical neck LEFT humerus without gross dislocation. IMPRESSION: Enlargement of cardiac silhouette with bronchitic changes and bibasilar atelectasis. Displaced fracture at surgical neck of LEFT humerus. Electronically Signed   By: Lavonia Dana M.D.   On: 08/20/2018 16:11     ASSESSMENT AND PLAN:   84 year old female with history of dementia and chronic systolic heart failure ejection fraction 40 to 45% with chronic left bundle branch block who presented due to lower extremity edema and recent fall.  1.  Acute on chronic systolic heart failure: Patient has diuresed well.  She is -2600.   IV Lasix changed to oral Lasix  Cardiology consultation appreciated  CHF clinic upon discharge Continue Coreg   2.  Urinary tract infection: Continue Rocephin for 5 days No urine culture  3.  Left humeral fracture after mechanical fall:  She  has been evaluated by orthopedic surgery. Due to her baseline function and severe dementia orthopedic surgery is recommending nonoperative management. Patient is nonweightbearing on left upper extremity Keep arm in sling She will follow-up with Ortho in 1 to 2 weeks.   4.  Essential hypertension: Continue Coreg    Discussed with  patient's family at bedside who agrees with plan. Discussed with clinical social worker   CODE STATUS: DNR  TOTAL TIME TAKING CARE OF THIS PATIENT: 59minutes.     POSSIBLE D/C 2 days, DEPENDING ON CLINICAL CONDITION.   Samary Shatz M.D on 08/22/2018 at 12:46 PM  Between 7am to 6pm - Pager - [phone] After 6pm go to [email or web address] - password [email or web address] [facility]  Office  [phone]  CC: Primary care physician; Adin Hector, MD  Note: This dictation was prepared with Dragon dictation along with smaller phrase technology. Any transcriptional errors that result from this process are unintentional.

## 2018-08-22 NOTE — Clinical Social Work Note (Signed)
Clinical Social Work Assessment  Patient Details  Name: Krista Phillips MRN: [id number] Date of Birth: [birth year]  Date of referral:  08/22/18               Reason for consult:  Facility Placement                Permission sought to share information with:  Chartered certified accountant granted to share information::  Yes, Verbal Permission Granted  Name::        Agency::  [facility]  Relationship::     Contact Information:     Housing/Transportation Living arrangements for the past 2 months:  [place] of Information:  Medical Team, Power of Attorney, Siblings Patient Interpreter Needed:  None Criminal Activity/Legal Involvement Pertinent to Current Situation/Hospitalization:  No - Comment as needed Significant Relationships:  Warehouse manager, Other Family Members, Siblings Lives with:  Facility Resident Do you feel safe going back to the place where you live?  Yes Need for family participation in patient care:  Yes (Comment)(Patient is AMS with dementia.)  Care giving concerns:  PT recommendation for LTAC, patient admitted from [facility] MCU; patient's HCPOA needs information about increased level of care for long term plan.   Social Worker assessment / plan:  The CSW met with the patient's family to discuss discharge planning. The CSW provided a copy of the CMS.gov listings for SNFs within a 25 mile radius of [facility]. The family inquired about levels of care (SNF for short term rehab, SNF for long term care, and ALF with memory care). The CSW provided information requested and explained the Medicare guidelines for short term rehab. The CSW cautioned the family that the patient would need to be assessed for long term care needs under Medicaid prior to making the decision to discontinue services from [facility]. The family understood. The family's preference is [facility]  The CSW has begun the referral  process and placed a copy of the CMS.gov print out on the chart for upload. The patient will most likely discharge on Monday pending bed offers, medical stability, and acceptance of offers. CSW is following.  Employment status:  Retired Forensic scientist:  Information systems manager, Medicaid In [place] PT Recommendations:  LTAC Information / Referral to community resources:  [place]  Patient/Family's Response to care:  The patient's family thanked the CSW and had appropriate questions about care needs.  Patient/Family's Understanding of and Emotional Response to Diagnosis, Current Treatment, and Prognosis:  The patient's family understands that the patient may need a higher level of care than provided at the MCU and are in agreement with pursuing short term rehab in the meantime while the patient's needs are assessed.  Emotional Assessment Appearance:  Appears stated age Attitude/Demeanor/Rapport:  Lethargic Affect (typically observed):  Stable Orientation:  Oriented to Self, Oriented to Place Alcohol / Substance use:  Never Used Psych involvement (Current and /or in the community):  No (Comment)  Discharge Needs  Concerns to be addressed:  Discharge Planning Concerns, Care Coordination Readmission within the last 30 days:  Yes Current discharge risk:  Chronically ill, Physical Impairment, Cognitively Impaired Barriers to Discharge:  Continued Medical Work up   [employer], LCSW 08/22/2018, 2:13 PM

## 2018-08-23 ENCOUNTER — Inpatient Hospital Stay: Payer: Medicare Other

## 2018-08-23 MED ORDER — SODIUM CHLORIDE 0.9% FLUSH
3.0000 mL | Freq: Two times a day (BID) | INTRAVENOUS | Status: DC
  Administered 2018-08-23 – 2018-08-24 (×2): 3 mL via INTRAVENOUS

## 2018-08-23 MED ORDER — POTASSIUM CHLORIDE CRYS ER 20 MEQ PO TBCR
40.0000 meq | EXTENDED_RELEASE_TABLET | Freq: Once | ORAL | Status: AC
  Administered 2018-08-23: 40 meq via ORAL
  Filled 2018-08-23: qty 2

## 2018-08-23 MED ORDER — FUROSEMIDE 10 MG/ML IJ SOLN
40.0000 mg | Freq: Once | INTRAMUSCULAR | Status: AC
  Administered 2018-08-23: 40 mg via INTRAVENOUS
  Filled 2018-08-23: qty 4

## 2018-08-23 NOTE — Progress Notes (Signed)
Nutrition Brief Note  RD received consult for nutrition education on CHF.  Reviewed chart. Patient has dementia and presents from [facility] memory care unit. PT is recommending LTAC after discharge. Discussed with RN. Patient is not appropriate for education in setting of dementia. Patient can be placed on 2 gram sodium diet and fluid restriction at any facility she is at. Noted here she is actually on a soft diet, which is GI diet that is low in fiber and fat. If patient needs a mechanical soft diet recommend ordering dysphagia 3 diet. Per RN patient tolerated eggs and oatmeal well this AM.  Please consult RD if any further nutrition needs arise.  Willey Blade, MS, [place], LDN Office: [phone] Pager: [phone] After Hours/Weekend Pager: [phone]

## 2018-08-23 NOTE — Progress Notes (Signed)
Krista Phillips at [place] NAME: Krista Phillips    MR#:  [id number]  DATE OF BIRTH:  [birth year]  SUBJECTIVE:  Patient had low O2 this am   REVIEW OF SYSTEMS:  Unable to obtain: Dementia   Tolerating Diet: yes      DRUG ALLERGIES:  No Known Allergies  VITALS:  Blood pressure 130/64, pulse 93, temperature 98 F (36.7 C), temperature source Oral, resp. rate 18, height 5\' 2"  (1.575 m), weight 60 kg, SpO2 95 %.  PHYSICAL EXAMINATION:  Constitutional: Appears well-developed and well-nourished. No distress. HENT: Normocephalic. Marland Kitchen Oropharynx is clear and moist.  Eyes: Conjunctivae are normal. no scleral icterus.  Neck: Normal ROM. Neck supple. No JVD. No tracheal deviation. CVS: RRR, S1/S2 +,2/6 murmurs, no gallops, no carotid bruit.  Pulmonary: Effort normal +crackles at bases, no stridor, rhonchi, wheezes, rales.  Abdominal: Soft. BS +,  no distension, tenderness, rebound or guarding.  Musculoskeletal: 1+ lower extremity edema and no tenderness.  Does not move left upper extremity Neuro: No focal deficit  skin: Skin is warm and dry. No rash noted. Bruising noted on neck Psychiatric: Pleasantly confused   LABORATORY PANEL:   CBC Recent Labs  Lab 08/21/18 0016  WBC 12.0*  HGB 10.6*  HCT 32.9*  PLT 347   ------------------------------------------------------------------------------------------------------------------  Chemistries  Recent Labs  Lab 08/20/18 1823  08/22/18 0352  NA  --    < > 139  K  --    < > 3.3*  CL  --    < > 105  CO2  --    < > 27  GLUCOSE  --    < > 108*  BUN  --    < > 21  CREATININE  --    < > 0.80  CALCIUM  --    < > 8.1*  AST 23  --   --   ALT 13  --   --   ALKPHOS 136*  --   --   BILITOT 1.3*  --   --    < > = values in this interval not displayed.   ------------------------------------------------------------------------------------------------------------------  Cardiac Enzymes Recent Labs   Lab 08/20/18 1540 08/20/18 1823 08/21/18 0016  TROPONINI 0.03* 0.03* 0.03*   ------------------------------------------------------------------------------------------------------------------  RADIOLOGY:  No results found.   ASSESSMENT AND PLAN:   84 year old female with history of dementia and chronic systolic heart failure ejection fraction 40 to 45% with chronic left bundle branch block who presented due to lower extremity edema and recent fall.  1.  Acute on chronic systolic heart failure: Patient has diuresed well.   She stil has crackles on exam  One time dose Lasix 40 mg IV tiday then continue PO as Lasix [facility] Cardiology consult appreciated  CHF clinic upon discharge Continue Coreg   2.  Urinary tract infection: Continue Rocephin and treat UTI for 5 days total  Today D 3/5  No urine culture ordered  3.  Left humeral fracture after mechanical fall:  She has been evaluated by orthopedic surgery. Due to her baseline function and severe dementia orthopedic surgery is recommending nonoperative management. Patient is nonweightbearing on left upper extremity Keep arm in sling She will follow-up with Ortho in 1 to 2 weeks.   4.  Essential hypertension: Continue Coreg  5. Hypokalemia: This has been repleted  Discussed with patient's family at bedside who agrees with plan. Discussed with clinical social worker   CODE STATUS: DNR  TOTAL TIME TAKING  CARE OF THIS PATIENT: 39minutes.     POSSIBLE D/C tomorrow to SNF, DEPENDING ON CLINICAL CONDITION.   Krista Phillips M.D on 08/23/2018 at 11:30 AM  Between 7am to 6pm - Pager - [phone] After 6pm go to [email or web address] - password [email or web address] [name] [facility]  Office  [phone]  CC: Primary care physician; Krista Hector, MD  Note: This dictation was prepared with Dragon dictation along with smaller phrase technology. Any transcriptional errors that result from this process are unintentional.

## 2018-08-23 NOTE — Plan of Care (Signed)
  Problem: Safety: Goal: Ability to remain free from injury will improve Outcome: Progressing   

## 2018-08-23 NOTE — Progress Notes (Signed)
CCMD called and reported that pt sat went down to 86 % at RA. Pt was placed on 1 liter oxygen and sat went up to 93-94%. Will continue to monitor.

## 2018-08-24 LAB — BASIC METABOLIC PANEL
ANION GAP: 7 (ref 5–15)
BUN: 24 mg/dL — ABNORMAL HIGH (ref 8–23)
CO2: 26 mmol/L (ref 22–32)
Calcium: 8.5 mg/dL — ABNORMAL LOW (ref 8.9–10.3)
Chloride: 107 mmol/L (ref 98–111)
Creatinine, Ser: 0.56 mg/dL (ref 0.44–1.00)
GFR calc non Af Amer: >60 mL/min (ref >60)
Glucose, Bld: 119 mg/dL — ABNORMAL HIGH (ref 70–99)
Potassium: 4.3 mmol/L (ref 3.5–5.1)
Sodium: 140 mmol/L (ref 135–145)

## 2018-08-24 MED ORDER — FUROSEMIDE 40 MG PO TABS: 40.0000 mg | ORAL_TABLET | Freq: Every day | ORAL | 0 refills | Status: AC

## 2018-08-24 MED ORDER — CEPHALEXIN 500 MG PO CAPS: 500.0000 mg | ORAL_CAPSULE | Freq: Two times a day (BID) | ORAL | 0 refills | Status: AC

## 2018-08-24 NOTE — Clinical Social Work Placement (Signed)
   CLINICAL SOCIAL WORK PLACEMENT  NOTE  Date:  08/24/2018  Patient Details  Name: Krista Phillips MRN: [id number] Date of Birth: 1927-06-19  Clinical Social Work is seeking post-discharge placement for this patient at the [place] level of care (*CSW will initial, date and re-position this form in  chart as items are completed):  Yes   Patient/family provided with [place] Work Department's list of facilities offering this level of care within the geographic area requested by the patient (or if unable, by the patient's family).  Yes   Patient/family informed of their freedom to choose among providers that offer the needed level of care, that participate in Medicare, Medicaid or managed care program needed by the patient, have an available bed and are willing to accept the patient.  Yes   Patient/family informed of [facility]'s ownership interest in [facility] and [facility], as well as of the fact that they are under no obligation to receive care at these facilities.  PASRR submitted to EDS on 08/22/18     PASRR number received on 08/22/18     Existing PASRR number confirmed on       FL2 transmitted to all facilities in geographic area requested by pt/family on 08/22/18     FL2 transmitted to all facilities within larger geographic area on       Patient informed that his/her managed care company has contracts with or will negotiate with certain facilities, including the following:        Yes   Patient/family informed of bed offers received.  Patient chooses bed at [facility]     Physician recommends and patient chooses bed at      Patient to be transferred to [facility] on 08/24/18.  Patient to be transferred to facility by [facility]     Patient family notified on 08/24/18 of transfer.  Name of family member notified:  Lesle Chris patient's niece and [place] Please sign FL2,  Please sign DNR     Additional Comment:    _______________________________________________ Ross Ludwig, LCSWA 08/24/2018, 12:12 PM

## 2018-08-24 NOTE — Discharge Summary (Signed)
[facility] at [place] NAME: Krista Phillips    MR#:  [id number]  DATE OF BIRTH:  Jan 06, 1927  DATE OF ADMISSION:  08/20/2018 ADMITTING PHYSICIAN: Lance Coon, MD  DATE OF DISCHARGE: 08/24/2018  PRIMARY CARE PHYSICIAN: Tama High III, MD    ADMISSION DIAGNOSIS:  Acute cystitis without hematuria [N30.00] Edema, unspecified type [R60.9]  DISCHARGE DIAGNOSIS:  Principal Problem:   Anasarca Active Problems:   Chronic systolic CHF (congestive heart failure) (HCC)   Humerus fracture   HTN (hypertension)   UTI (urinary tract infection)   SECONDARY DIAGNOSIS:   Past Medical History:  Diagnosis Date  . B12 deficiency   . Cardiomyopathy ([place])   . CHF (congestive heart failure) ([place])   . Degenerative arthritis   . Hypertension   . Left bundle branch block (LBBB)     HOSPITAL COURSE:   84 year old female with history of dementia and chronic systolic heart failure ejection fraction 40 to 45% with chronic left bundle branch block who presented due to lower extremity edema and recent fall.  1.  Acute on chronic systolic heart failure: Patient has diuresed well.   She was evaluated by cardiology while in hospital.  She will be discharged on oral Lasix and have a referral for CHF clinic.  She will continue Coreg   2.  Urinary tract infection: She was on Rocephin and transitioned to oral Keflex for 2 more days. No urine culture ordered  3.  Left humeral fracture after mechanical fall:  She has been evaluated by orthopedic surgery. Due to her baseline function and severe dementia orthopedic surgery is recommending nonoperative management. Patient is nonweightbearing on left upper extremity Keep arm in sling She will follow-up with Ortho in 1 to 2 weeks.   4.  Essential hypertension: Continue Coreg  5. Hypokalemia: This has been repleted   DISCHARGE CONDITIONS AND DIET:   Stable for discharge on heart healthy diet with  aspiration precautions  CONSULTS OBTAINED:  Treatment Team:  Leim Fabry, MD Teodoro Spray, MD  DRUG ALLERGIES:  No Known Allergies  DISCHARGE MEDICATIONS:   Allergies as of 08/24/2018   No Known Allergies     Medication List    STOP taking these medications   HYDROcodone-acetaminophen 7.5-325 mg/15 ml solution Commonly known as:  HYCET   lidocaine 5 % Commonly known as:  LIDODERM     TAKE these medications   acetaminophen 500 MG tablet Commonly known as:  TYLENOL Take 500 mg by mouth every 6 (six) hours as needed for mild pain, moderate pain or fever.   carvedilol 3.125 MG tablet Commonly known as:  COREG Take 3.125 mg by mouth 2 (two) times daily with a meal.   cephALEXin 500 MG capsule Commonly known as:  KEFLEX Take 1 capsule (500 mg total) by mouth 2 (two) times daily for 2 days.   diclofenac sodium 1 % Gel Commonly known as:  VOLTAREN Apply 4 g topically 3 (three) times daily as needed (arthritis pain).   furosemide 40 MG tablet Commonly known as:  LASIX Take 1 tablet (40 mg total) by mouth daily. Start taking on:  August 25, 2018   guaiFENesin 100 MG/5ML Soln Commonly known as:  ROBITUSSIN Take 5 mLs (100 mg total) by mouth every 4 (four) hours as needed for cough or to loosen phlegm.   nystatin-triamcinolone ointment Commonly known as:  MYCOLOG Apply 1 application topically 2 (two) times daily as needed (rash).   vitamin B-12  1000 MCG tablet Commonly known as:  CYANOCOBALAMIN Take 1,000 mcg by mouth daily.         Today   CHIEF COMPLAINT:   Patient doing well no acute events overnight   VITAL SIGNS:  Blood pressure 120/69, pulse 96, temperature 98.3 F (36.8 C), temperature source Oral, resp. rate 18, height 5\' 2"  (1.575 m), weight 60.7 kg, SpO2 97 %.   REVIEW OF SYSTEMS:  Review of Systems  Unable to perform ROS: Dementia     PHYSICAL EXAMINATION:   GENERAL:  84 y.o.-year-old patient lying in the bed with no acute  distress.  NECK:  Supple, no jugular venous distention. No thyroid enlargement, no tenderness.  LUNGS: Normal breath sounds bilaterally, no wheezing, rales,rhonchi  No use of accessory muscles of respiration.  CARDIOVASCULAR: S1, S2 normal. No murmurs, rubs, or gallops.  ABDOMEN: Soft, non-tender, non-distended. Bowel sounds present. No organomegaly or mass.  EXTREMITIES: No pedal edema, cyanosis, or clubbing.  PSYCHIATRIC: The patient is alert and oriented x 3.  SKIN: No obvious rash, lesion, or ulcer.   DATA REVIEW:   CBC Recent Labs  Lab 08/21/18 0016  WBC 12.0*  HGB 10.6*  HCT 32.9*  PLT 347    Chemistries  Recent Labs  Lab 08/20/18 1823  08/24/18 0611  NA  --    < > 140  K  --    < > 4.3  CL  --    < > 107  CO2  --    < > 26  GLUCOSE  --    < > 119*  BUN  --    < > 24*  CREATININE  --    < > 0.56  CALCIUM  --    < > 8.5*  AST 23  --   --   ALT 13  --   --   ALKPHOS 136*  --   --   BILITOT 1.3*  --   --    < > = values in this interval not displayed.    Cardiac Enzymes Recent Labs  Lab 08/20/18 1540 08/20/18 1823 08/21/18 0016  TROPONINI 0.03* 0.03* 0.03*    Microbiology Results  @MICRORSLT48 @  RADIOLOGY:  Dg Chest 1 View  Result Date: 08/23/2018 CLINICAL DATA:  CHF, hypertension, cardiomyopathy EXAM: CHEST  1 VIEW COMPARISON:  Portable exam 11 hours compared to 08/20/2018 FINDINGS: Borderline enlargement of cardiac silhouette. Mediastinal contours and pulmonary vascularity normal. Bronchitic changes with minimal LEFT basilar atelectasis. No definite infiltrate, pleural effusion or pneumothorax. Bones demineralized with displaced fracture at surgical neck LEFT humerus again identified. IMPRESSION: No interval change. Bronchitic changes and minimal LEFT basilar atelectasis. Electronically Signed   By: Lavonia Dana M.D.   On: 08/23/2018 14:59      Allergies as of 08/24/2018   No Known Allergies     Medication List    STOP taking these medications    HYDROcodone-acetaminophen 7.5-325 mg/15 ml solution Commonly known as:  HYCET   lidocaine 5 % Commonly known as:  LIDODERM     TAKE these medications   acetaminophen 500 MG tablet Commonly known as:  TYLENOL Take 500 mg by mouth every 6 (six) hours as needed for mild pain, moderate pain or fever.   carvedilol 3.125 MG tablet Commonly known as:  COREG Take 3.125 mg by mouth 2 (two) times daily with a meal.   cephALEXin 500 MG capsule Commonly known as:  KEFLEX Take 1 capsule (500 mg total) by mouth 2 (two)  times daily for 2 days.   diclofenac sodium 1 % Gel Commonly known as:  VOLTAREN Apply 4 g topically 3 (three) times daily as needed (arthritis pain).   furosemide 40 MG tablet Commonly known as:  LASIX Take 1 tablet (40 mg total) by mouth daily. Start taking on:  August 25, 2018   guaiFENesin 100 MG/5ML Soln Commonly known as:  ROBITUSSIN Take 5 mLs (100 mg total) by mouth every 4 (four) hours as needed for cough or to loosen phlegm.   nystatin-triamcinolone ointment Commonly known as:  MYCOLOG Apply 1 application topically 2 (two) times daily as needed (rash).   vitamin B-12 1000 MCG tablet Commonly known as:  CYANOCOBALAMIN Take 1,000 mcg by mouth daily.         Management plans discussed with the patient's family and they are in agreement. Stable for discharge   Patient should follow up with pcp  CODE STATUS:     Code Status Orders  (From admission, onward)         Start     Ordered   08/20/18 2342  Do not attempt resuscitation (DNR)  Continuous    Question Answer Comment  In the event of cardiac or respiratory ARREST Do not call a "code blue"   In the event of cardiac or respiratory ARREST Do not perform Intubation, CPR, defibrillation or ACLS   In the event of cardiac or respiratory ARREST Use medication by any route, position, wound care, and other measures to relive pain and suffering. May use oxygen, suction and manual treatment of airway  obstruction as needed for comfort.   Comments RN may pronouunce      08/20/18 2342        Code Status History    Date Active Date Inactive Code Status Order ID Comments User Context   06/28/2018 1833 06/30/2018 1625 DNR [id number]  Nicholes Mango, MD ED    Advance Directive Documentation     Most Recent Value  Type of Advance Directive  Out of facility DNR (pink MOST or yellow form)  Pre-existing out of facility DNR order (yellow form or pink MOST form)  -  "MOST" Form in Place?  -      TOTAL TIME TAKING CARE OF THIS PATIENT: 38 minutes.    Note: This dictation was prepared with Dragon dictation along with smaller phrase technology. Any transcriptional errors that result from this process are unintentional.  Vaughn Beaumier M.D on 08/24/2018 at 10:50 AM  Between 7am to 6pm - Pager - [phone] After 6pm go to [email or web address] - password [email or web address] [place] [facility]  Office  [phone]  CC: Primary care physician; Adin Hector, MD

## 2018-08-24 NOTE — Progress Notes (Signed)
Pt to d/c per MD order, Report called to [name], RN at [facility], [facility] has been called for transportation.

## 2018-08-24 NOTE — Progress Notes (Signed)
Krista Phillips to be D/C'd Nursing Home([facility]) per MD order.  Prescriptions sent with pt. Report given to Surveyor, quantity at [facility].  Allergies as of 08/24/2018   No Known Allergies     Medication List    STOP taking these medications   HYDROcodone-acetaminophen 7.5-325 mg/15 ml solution Commonly known as:  HYCET   lidocaine 5 % Commonly known as:  LIDODERM     TAKE these medications   acetaminophen 500 MG tablet Commonly known as:  TYLENOL Take 500 mg by mouth every 6 (six) hours as needed for mild pain, moderate pain or fever.   carvedilol 3.125 MG tablet Commonly known as:  COREG Take 3.125 mg by mouth 2 (two) times daily with a meal.   cephALEXin 500 MG capsule Commonly known as:  KEFLEX Take 1 capsule (500 mg total) by mouth 2 (two) times daily for 2 days.   diclofenac sodium 1 % Gel Commonly known as:  VOLTAREN Apply 4 g topically 3 (three) times daily as needed (arthritis pain).   furosemide 40 MG tablet Commonly known as:  LASIX Take 1 tablet (40 mg total) by mouth daily. Start taking on:  August 25, 2018   guaiFENesin 100 MG/5ML Soln Commonly known as:  ROBITUSSIN Take 5 mLs (100 mg total) by mouth every 4 (four) hours as needed for cough or to loosen phlegm.   nystatin-triamcinolone ointment Commonly known as:  MYCOLOG Apply 1 application topically 2 (two) times daily as needed (rash).   vitamin B-12 1000 MCG tablet Commonly known as:  CYANOCOBALAMIN Take 1,000 mcg by mouth daily.       Vitals:   08/24/18 0954 08/24/18 1536  BP: 120/69 103/85  Pulse: 96 91  Resp: 18   Temp:  98.4 F (36.9 C)  SpO2: 97% 96%    Tele box removed and returned. Skin clean, dry and intact without evidence of skin break down, no evidence of skin tears noted. IV catheter discontinued intact. Site without signs and symptoms of complications. Dressing and pressure applied. Pt denies pain at this time. No complaints noted.   Patient escorted via stretcher,  and D/C to SNF via EMS.  Rolley Sims

## 2018-08-24 NOTE — Care Management Important Message (Signed)
Important Message  Patient Details  Name: Krista Phillips MRN: [id number] Date of Birth: 12/16/1926   Medicare Important Message Given:  Yes    Elza Rafter, RN 08/24/2018, 4:01 PM

## 2018-08-24 NOTE — Clinical Social Work Note (Addendum)
CSW spoke to patient's niece who is the [facility], she would like patient to go to [facility].  CSW contacted [facility] and they can accept patient today.  Patient to be d/c'ed today to [facility] room 804.  Patient and family agreeable to plans will transport via ems RN to call report [phone].  Patient's niece is aware of discharge today.  Evette Cristal, MSW, [place]

## 2018-08-25 ENCOUNTER — Telehealth: Payer: Self-pay | Admitting: Student

## 2018-08-25 NOTE — Telephone Encounter (Signed)
NP Phone call: returned call to niece [name]. She states that patient transferred to [facility] late yesterday for rehab. She is uncertain of plan going forward, regarding patient staying at facility long term. She would like for Palliative to continue following patient. She is made aware that referral intake will obtain order to continue following patient in facility. She verbalizes understanding.

## 2018-09-04 ENCOUNTER — Non-Acute Institutional Stay: Payer: Medicare Other | Admitting: Student

## 2018-09-04 VITALS — BP 112/70 | HR 78 | Resp 16

## 2018-09-04 DIAGNOSIS — Z515 Encounter for palliative care: Secondary | ICD-10-CM

## 2018-09-04 NOTE — Progress Notes (Signed)
[facility] Telephone: [phone] Fax: [phone]  PATIENT NAME: Krista Phillips DOB: 1927/08/01 MRN: [id number]  PRIMARY CARE PROVIDER:   Juluis Pitch, MD  REFERRING PROVIDER:  Dr. Lovie Macadamia  RESPONSIBLE PARTY: [name] at [phone] or sister [name] at [phone].  ASSESSMENT: [name] is an 84 year old patient with multiple medical problems including dementia, congestive heart failure, Vitamin B12 deficiency, hyperglycemia, hypertension, osteoarthritis, thoracic compression fracture. Patient is sitting up to wheel chair upon arrival. She has pleasant affect. She is more alert today compared to previous visit; she stays awake during visit and answers questions clearly. Discussed goals of care with niece [name]; family would like for patient to have higher level of care than previous assisted living and stay at [facility] if [facility].       RECOMMENDATIONS and PLAN:  1. Medical goals of therapy: Continue PT/OT as directed at [facility].  2. Symptom management: pain-continue acetaminophen and diclofenac gel prn.  3. Discharge Planning: Patient will continue to reside at [facility]; niece is to follow up with SW regarding plan on staying long-term at SNF. 4. Emotional/spiritual support: Discussed with patient, niece [name] and facility nurse [name]; they are encouraged to call with questions.    Palliative Care to continue to follow for emotional/spiritual support, ongoing discussions trajectory of chronic disease progression, medical goals of therapy, monitor for symptoms with management, and reduce ED and hospitalizations with recommendations.    I spent 15 minutes providing this consultation,  from 11:00am to 11:15am. More than 50% of the time in this consultation was spent coordinating communication.   HISTORY OF PRESENT ILLNESS:  Krista Phillips is a 84 y.o. year old female with multiple medical problems including dementia, congestive  heart failure, Vitamin B12 deficiency, hyperglycemia, hypertension, osteoarthritis, thoracic compression fracture. She was seen in emergency room on 08/12/2018 due to fall and found to have a left humerus fracture. She was hospitalized due to urinary tract infection and discharged to [facility] on 08/24/18 for rehab. Palliative Care was asked to help address ongoing goals of care. She is currently receiving PT and OT. Wheel chair being used for locomotion. She is standing and ambulating in parallel bars per PTA. Immobilizer is present to left upper extremity. Staff nurse [name] reports patient requires assistance/set up help with meals. Fair appetite reported; she is receiving nutritional supplement three times a day. [name] denies having pain or dyspnea. She reports sleeping well at night. No recent falls reported. Staff and niece [name] report patient doing well overall.   CODE STATUS: DNR  PPS: 40% HOSPICE ELIGIBILITY/DIAGNOSIS: TBD  PAST MEDICAL HISTORY:  Past Medical History:  Diagnosis Date  . B12 deficiency   . Cardiomyopathy ([place])   . CHF (congestive heart failure) ([place])   . Degenerative arthritis   . Hypertension   . Left bundle branch block (LBBB)     SOCIAL HX:  Social History   Tobacco Use  . Smoking status: Never Smoker  . Smokeless tobacco: Never Used  Substance Use Topics  . Alcohol use: No    ALLERGIES: No Known Allergies   PERTINENT MEDICATIONS:  Outpatient Encounter Medications as of 09/04/2018  Medication Sig  . acetaminophen (TYLENOL) 500 MG tablet Take 500 mg by mouth every 6 (six) hours as needed for mild pain, moderate pain or fever.  . carvedilol (COREG) 3.125 MG tablet Take 3.125 mg by mouth 2 (two) times daily with a meal.  . diclofenac sodium (VOLTAREN) 1 % GEL  Apply 4 g topically 3 (three) times daily as needed (arthritis pain).  . furosemide (LASIX) 40 MG tablet Take 1 tablet (40 mg total) by mouth daily.  Marland Kitchen guaiFENesin (ROBITUSSIN) 100 MG/5ML  SOLN Take 5 mLs (100 mg total) by mouth every 4 (four) hours as needed for cough or to loosen phlegm.  . nystatin-triamcinolone ointment (MYCOLOG) Apply 1 application topically 2 (two) times daily as needed (rash).   . vitamin B-12 (CYANOCOBALAMIN) 1000 MCG tablet Take 1,000 mcg by mouth daily.   No facility-administered encounter medications on file as of 09/04/2018.     PHYSICAL EXAM:   General: NAD, frail appearing, thin Cardiovascular: regular rate and rhythm Pulmonary: clear ant fields Abdomen: soft, nontender, + bowel sounds GU: no suprapubic tenderness Extremities: trace edema to bilateral lower extremities, no joint deformities Skin: no rashes Neurological: Weakness but otherwise nonfocal  Ezekiel Slocumb, NP

## 2018-09-08 ENCOUNTER — Ambulatory Visit: Payer: Medicare Other | Admitting: Family

## 2018-10-16 ENCOUNTER — Telehealth: Payer: Self-pay | Admitting: Student

## 2018-10-16 NOTE — Telephone Encounter (Signed)
Spoke with Niece [name] to set up follow up Palliative Medicine visit. Visit made for 10/19/2018 at 1pm.

## 2018-10-19 ENCOUNTER — Non-Acute Institutional Stay: Payer: Medicare Other | Admitting: Student

## 2018-10-19 VITALS — BP 112/70 | HR 88 | Resp 18 | Wt 122.0 lb

## 2018-10-19 DIAGNOSIS — Z515 Encounter for palliative care: Secondary | ICD-10-CM

## 2018-10-19 NOTE — Progress Notes (Signed)
[facility] Consult Note Telephone: [phone]  Fax: [phone]  PATIENT NAME: Krista Phillips DOB: 1927/05/14 MRN: [id number]  PRIMARY CARE PROVIDER:  Dr. Orbie Pyo PROVIDER: Dr. Lovie Macadamia   RESPONSIBLE PARTY:  [name] or sister [name]  ASSESSMENT:  Krista Phillips is sitting in common area upon arrival. Her sister [name] and niece [name] are present. Krista Phillips does answer some direct questions, but otherwise does not engage. No acute distress noted. Mild edema noted to left hand; hand elevated on pillow. Discussed ongoing goals of care. We discussed symptom management. We discussed her recent weight loss as well as criteria for Hospice.     RECOMMENDATIONS and PLAN:  1. Code status: DNR 2. Medical goals of therapy: follow up with orthopedics as scheduled secondary to left proximal humerus fracture. Will continue to monitor for changes and declines; will refer for Hospice assessment if patient continues decline and meets criteria guidelines for continued weight loss.  3. Symptom management:pain-continue acetaminophen and diclofenac gel prn.Weight loss-continue nutritional supplement three times a day with meals. Staff encouraged to assist with meals; continue weekly weights. Edema-staff encouraged to elevate left hand on pillow; monitor for worsening edema. 4. Discharge Planning: Patient will continue to reside at [facility]. 5. Emotional/spiritual support: Discussed with patient, niece [name] and facility nurse [name]; they are encouraged to call with questions.   Palliative Care to continue to follow for emotional/spiritual support, ongoing discussions trajectory of chronic disease progression, medical goals of therapy, monitor for symptoms with management, and reduce ED and hospitalizations with recommendations.  Palliative Care to follow up in two weeks.   I spent 35 minutes providing this consultation,   from 1:00pm to 1:35pm. More than 50% of the time in this consultation was spent coordinating communication.   HISTORY OF PRESENT ILLNESS:  Krista Phillips is a 85 y.o.  female with multiple medical problems including dementia, congestive heart failure, Vitamin B12 deficiency, hyperglycemia, hypertension, osteoarthritis, thoracic compression fracture. She was seen in emergency room on 08/12/2018 due to fall and found to have a left humerus fracture. She was hospitalized due to urinary tract infection and discharged to [facility] on 08/24/18 for rehab. Palliative Care was asked to help address ongoing goals of care. Family was unsure if patient is still receiving any therapy; nurse on duty was also unsure if therapy is still working with patient. Wheel chair being used for locomotion. Debbie states her last appointment with orthopedics, "her shoulder was stable." Sling has been discontinued to left upper extremity; she has follow up appointment scheduled with orthopedics. Krista Phillips is sleeping more per family. Niece [name] states she "doesn't get much out of her." Staff reports patient eating 75-100% of breakfast and lunch the past several days, and eating 25-50% of dinner meals. She is receiving nutritional supplement three times a day with meals. Sister [name] states patient requires more time to eat. She is weighed daily; weight on 10/16/2018 was 122 pounds. She was 131.5 pounds on 07/15/18. Krista Phillips denies having pain or dyspnea. She reports sleeping well at night. No recent falls reported. She usually goes to common area during the day; she states she does not go to activities. No other concerns expressed.   CODE STATUS: DNR  PPS: 40% HOSPICE ELIGIBILITY/DIAGNOSIS: TBD  PAST MEDICAL HISTORY:  Past Medical History:  Diagnosis Date  . B12 deficiency   . Cardiomyopathy ([place])   . CHF (congestive heart failure) ([place])   . Degenerative arthritis   .  Hypertension   . Left bundle branch block (LBBB)       SOCIAL HX:  Social History   Tobacco Use  . Smoking status: Never Smoker  . Smokeless tobacco: Never Used  Substance Use Topics  . Alcohol use: No    ALLERGIES: No Known Allergies   PERTINENT MEDICATIONS:  Outpatient Encounter Medications as of 10/19/2018  Medication Sig  . acetaminophen (TYLENOL) 500 MG tablet Take 500 mg by mouth every 6 (six) hours as needed for mild pain, moderate pain or fever.  . carvedilol (COREG) 3.125 MG tablet Take 3.125 mg by mouth 2 (two) times daily with a meal.  . diclofenac sodium (VOLTAREN) 1 % GEL Apply 4 g topically 3 (three) times daily as needed (arthritis pain).  . furosemide (LASIX) 40 MG tablet Take 1 tablet (40 mg total) by mouth daily.  Marland Kitchen guaiFENesin (ROBITUSSIN) 100 MG/5ML SOLN Take 5 mLs (100 mg total) by mouth every 4 (four) hours as needed for cough or to loosen phlegm.  . nystatin-triamcinolone ointment (MYCOLOG) Apply 1 application topically 2 (two) times daily as needed (rash).   . vitamin B-12 (CYANOCOBALAMIN) 1000 MCG tablet Take 1,000 mcg by mouth daily.  Marland Kitchen dextromethorphan-guaiFENesin (ROBITUSSIN-DM) 10-100 MG/5ML liquid Take 5 mLs by mouth every 4 (four) hours as needed for cough.   No facility-administered encounter medications on file as of 10/19/2018.     PHYSICAL EXAM:   General: NAD, frail appearing, thin Cardiovascular: regular rate and rhythm Pulmonary: clear ant fields Abdomen: soft, nontender, + bowel sounds GU: no suprapubic tenderness Extremities: no edema to lower extremities; mild edema to left hand Skin: no rashes Neurological: Weakness but otherwise nonfocal  Ezekiel Slocumb, NP

## 2018-11-02 ENCOUNTER — Non-Acute Institutional Stay: Payer: Medicare Other | Admitting: Student

## 2018-11-02 VITALS — BP 128/72 | HR 92 | Resp 20 | Wt 126.0 lb

## 2018-11-02 DIAGNOSIS — Z515 Encounter for palliative care: Secondary | ICD-10-CM

## 2018-11-02 NOTE — Progress Notes (Signed)
[facility] Consult Note Telephone: [phone]  Fax: [phone]  PATIENT NAME: Krista Phillips DOB: 22-Nov-1926 MRN: [id number]  PRIMARY CARE PROVIDER:   Dr. Orbie Pyo PROVIDER: Dr. Lovie Macadamia   RESPONSIBLE PARTY:  [name], Sister [name]  ASSESSMENT:  Krista Phillips was eating lunch upon arrival. She was escorted to common area after meal and being toileted by facility staff. Mrs. Lemar does answer most direct questions. No acute distress noted. Mild edema to left hand; hand elevated on pillow. Discussed ongoing goals of care. Family would like to observe a therapy session to see how patient is doing; they are given therapy contact person to arrange this. They are also made aware of facility care plan meetings every 90 days. Family had questions regarding hospice in facility, if patient continues to decline. NP answered questions regarding hospice and they are made aware that she will be monitored/assessed for changes and declines to meet criteria for hospice services.       RECOMMENDATIONS and PLAN:  1. Code status: DNR 2. Medical goals of therapy: continue OT and ST as ordered. Follow up with orthopedics as scheduled secondary to left proximal humerus fracture. Will continue to monitor for changes and declines; will refer for Hospice assessment if patient continues decline and meets criteria guidelines for continued weight loss.  3. Symptom management:pain-continue acetaminophen and diclofenac gel prn.Weight loss-continue nutritional supplement three times a day with meals. Staff encouraged to assist with meals; continue weekly weights. Edema-staff encouraged to elevate left hand on pillow; monitor for worsening edema. 4. Discharge Planning: Patient will continue to reside at [facility]. 5. Emotional/spiritual support: Discussed with patient, niece [name], sister [name] and facility staff; they are encouraged to  call with questions.   Palliative Care to continue to follow for emotional/spiritual support, ongoing discussions trajectory of chronic disease progression, medical goals of therapy, monitor for symptoms with management, and reduce ED and hospitalizations with recommendations.  Palliative Medicine will follow up in 4 weeks or sooner, if needed.   I spent 35 minutes providing this consultation,  from 1:00pm to 1:35pm. More than 50% of the time in this consultation was spent coordinating communication.   HISTORY OF PRESENT ILLNESS:  [name] is a 85 y.o. female with multiple medical problems including dementia, congestive heart failure, Vitamin B12 deficiency, hyperglycemia, hypertension, osteoarthritis, thoracic compression fracture, left humerus fracture. She is seen for follow up visit today; Palliative Care was asked to help addressongoinggoals of care. Krista Phillips is currently receiving occupational and speech therapy. Her left arm sling is now ordered prn. She is assisted to wheel chair daily; wheel chair being used for locomotion. She is dependent for adl's. Niece [name] reports patient have more episodes of bowel and bladder incontinence. Her appetite varies; she is also receiving nutritional supplement three times a day with meals. Her weight on 10/30/2018 was 126 pounds, last visit 122 pounds. She was 131.5 pounds on 07/15/18. [name] denies having pain or dyspnea. She reports sleeping well at night. She naps during the day. She has been sitting in common area or dining room during the day; although she does not speak much per family, she enjoys being around the people. No recent falls reported. She is on list to have nails trimmed by podiatry on 11/06/2018. No new medication orders reported per staff.    CODE STATUS: DNR  PPS: 40% HOSPICE ELIGIBILITY/DIAGNOSIS: TBD  PAST MEDICAL HISTORY:  Past Medical History:  Diagnosis Date  .  B12 deficiency   . Cardiomyopathy ([place])   . CHF  (congestive heart failure) ([place])   . Degenerative arthritis   . Hypertension   . Left bundle branch block (LBBB)     SOCIAL HX:  Social History   Tobacco Use  . Smoking status: Never Smoker  . Smokeless tobacco: Never Used  Substance Use Topics  . Alcohol use: No    ALLERGIES: No Known Allergies   PERTINENT MEDICATIONS:  Outpatient Encounter Medications as of 11/02/2018  Medication Sig  . acetaminophen (TYLENOL) 500 MG tablet Take 500 mg by mouth every 6 (six) hours as needed for mild pain, moderate pain or fever.  . carvedilol (COREG) 3.125 MG tablet Take 3.125 mg by mouth 2 (two) times daily with a meal.  . dextromethorphan-guaiFENesin (ROBITUSSIN-DM) 10-100 MG/5ML liquid Take 5 mLs by mouth every 4 (four) hours as needed for cough.  . diclofenac sodium (VOLTAREN) 1 % GEL Apply 4 g topically 3 (three) times daily as needed (arthritis pain).  . furosemide (LASIX) 40 MG tablet Take 1 tablet (40 mg total) by mouth daily.  Marland Kitchen guaiFENesin (ROBITUSSIN) 100 MG/5ML SOLN Take 5 mLs (100 mg total) by mouth every 4 (four) hours as needed for cough or to loosen phlegm.  . nystatin-triamcinolone ointment (MYCOLOG) Apply 1 application topically 2 (two) times daily as needed (rash).   . vitamin B-12 (CYANOCOBALAMIN) 1000 MCG tablet Take 1,000 mcg by mouth daily.   No facility-administered encounter medications on file as of 11/02/2018.     PHYSICAL EXAM:   General: NAD, frail appearing, thin Cardiovascular: regular rate and rhythm Pulmonary: clear ant fields Abdomen: soft, nontender, + bowel sounds GU: no suprapubic tenderness Extremities: mild edema to left hand, no joint deformities Skin: no rashes Neurological: Weakness but otherwise nonfocal  Ezekiel Slocumb, NP

## 2018-12-01 ENCOUNTER — Other Ambulatory Visit: Payer: Self-pay

## 2018-12-01 ENCOUNTER — Non-Acute Institutional Stay: Payer: Medicare Other | Admitting: Primary Care

## 2018-12-01 DIAGNOSIS — I5022 Chronic systolic (congestive) heart failure: Secondary | ICD-10-CM

## 2018-12-01 DIAGNOSIS — F0390 Unspecified dementia without behavioral disturbance: Secondary | ICD-10-CM

## 2018-12-01 DIAGNOSIS — Z515 Encounter for palliative care: Secondary | ICD-10-CM

## 2018-12-01 NOTE — Progress Notes (Signed)
[facility] Consult Note Telephone: [phone]  Fax: [phone]  PATIENT NAME: Krista Phillips DOB: 1926/11/02 MRN: [id number]  PRIMARY CARE PROVIDER:   Juluis Pitch, MD  REFERRING PROVIDER:  Juluis Pitch, MD [place] [address]   RESPONSIBLE PARTY:   Extended Emergency Contact Information Primary Emergency Contact: Silvano Rusk, Home Phone: [phone] Mobile Phone: [phone] Relation: Niece Secondary Emergency Contact: Sharpe,Hazel Address: [address]          [place], [place] [address] Johnnette Litter of [place] Phone: [phone] Relation: Sister   ASSESSMENT and RECOMMENDATIONS:   1. Pain:Recommend to  Schedule Acetaminophen   500 mg q 6 hrs. ATC for chronic arm pain and to improve ROM and goals of current PT plan Is having PT 3 x weekly for ongoing adhesive capuslitis of left shoulder. For therapy until plateau, then will continue plan of care with restorative aides.  Patient has much pain with use and some at rest which is causing her to not use her left arm much exacerbating the disuse.  2. Intake and Wt: Recommend meal time and snack dietary  Supplements. Weights are decreasing; patient may also benefit from assistance with feeding at meal time as patient may not be capable of getting enough intake on her own as her dementia progresses. Supplements per SNF policy for wt. Loss.  3. Goals of Care. DNR, DNH, limited most with trial abx, iv, no feeding tube. Will reach out to family for any concerns or questions they may have.  Palliative care will continue to follow for goals of care clarification, symptom management. Return 3-4 weeks or prn.  I spent 25 minutes providing this consultation,  from 1215 to 1240. More than 50% of the time in this consultation was spent coordinating communication.   HISTORY OF PRESENT ILLNESS:  Krista Phillips is a 85 y.o. year old female with multiple medical  problems including adhesive capsulitis of left shoulder s/p Left humerus  Fracture in December 2019, thoracic compression fracture dementia, CHF, BBB, arthritis. Palliative Care was asked to help address goals of care.   CODE STATUS: DNR limited most with trial abx, iv, no feeding tube, DNH PPS: 30% HOSPICE ELIGIBILITY/DIAGNOSIS: TBD  PAST MEDICAL HISTORY:  Past Medical History:  Diagnosis Date  . B12 deficiency   . Cardiomyopathy ([place])   . CHF (congestive heart failure) ([place])   . Degenerative arthritis   . Hypertension   . Left bundle branch block (LBBB)     SOCIAL HX:  Social History   Tobacco Use  . Smoking status: Never Smoker  . Smokeless tobacco: Never Used  Substance Use Topics  . Alcohol use: No    ALLERGIES: No Known Allergies   PERTINENT MEDICATIONS:  Outpatient Encounter Medications as of 12/01/2018  Medication Sig  . acetaminophen (TYLENOL) 500 MG tablet Take 500 mg by mouth every 6 (six) hours as needed for mild pain, moderate pain or fever.  . carvedilol (COREG) 3.125 MG tablet Take 3.125 mg by mouth 2 (two) times daily with a meal.  . dextromethorphan-guaiFENesin (ROBITUSSIN-DM) 10-100 MG/5ML liquid Take 5 mLs by mouth every 4 (four) hours as needed for cough.  . diclofenac sodium (VOLTAREN) 1 % GEL Apply 4 g topically 3 (three) times daily as needed (arthritis pain).  . furosemide (LASIX) 40 MG tablet Take 1 tablet (40 mg total) by mouth daily.  Marland Kitchen guaiFENesin (ROBITUSSIN) 100 MG/5ML SOLN Take 5 mLs (100 mg total) by mouth every  4 (four) hours as needed for cough or to loosen phlegm.  . nystatin-triamcinolone ointment (MYCOLOG) Apply 1 application topically 2 (two) times daily as needed (rash).   . vitamin B-12 (CYANOCOBALAMIN) 1000 MCG tablet Take 1,000 mcg by mouth daily.   No facility-administered encounter medications on file as of 12/01/2018.     PHYSICAL EXAM:  HR 88 Resp 18 Wt in Dec 133.8 124.6 this week  = 9 pound wt loss in 3 months.  General:  NAD, frail appearing, thin Cardiovascular: regular rate and rhythm, S1S2 Pulmonary: clear all fields, no cough Abdomen: soft, nontender, + bowel sounds, incontinent of stool, Eats 75%  Of 2 meals a day, wt loss of 9 lbs since early December. GU: no suprapubic tenderness, incontinent  Extremities: no edema, left shoulder with decreased ROM s/p fx this summer, now having PT for increased ROM and pain. Skin: no rashes, no wounds on gross exam Neurological: Weakness , dementia, requires assistance with most ADLs. Feeds self but slowly and often needs some assistance to finish meal.   Cyndia Skeeters DNP AGPCNP-BC

## 2018-12-02 ENCOUNTER — Telehealth: Payer: Self-pay | Admitting: Primary Care

## 2018-12-02 NOTE — Telephone Encounter (Signed)
T/c to POA Krista Phillips who asked about some symptoms of head and  Left hand being yellow on skin. Also had question of skin irritation on dorsal side.  Will assess at next visit. Gave report as to the physical exam. Family has not been able to visit for 2 weeks so she was glad to get news of the visit. Gave contact information and invited her to call with any future concerns.

## 2019-01-13 ENCOUNTER — Other Ambulatory Visit: Payer: Self-pay

## 2019-01-13 ENCOUNTER — Non-Acute Institutional Stay: Payer: Medicare Other | Admitting: Primary Care

## 2019-01-13 DIAGNOSIS — Z515 Encounter for palliative care: Secondary | ICD-10-CM

## 2019-01-13 NOTE — Progress Notes (Signed)
[facility] Pall[facility] Care Consult Note Telephone: [phone]  Fax: [phone]  TELEHEALTH VISIT STATEMENT Due to the COVID-19 crisis, this visit was done via telemedicine from my office. It was initiated and consented to by this patient and/or family.  PATIENT NAME: Krista Phillips DOB: Oct 24, 1926 MRN: [id number]  PRIMARY CARE PROVIDER:   Juluis Pitch, MD  REFERRING PROVIDER:  Juluis Pitch, MD [address] [place], [place] [address]  RESPONSIBLE PARTY:   Extended Emergency Contact Information Primary Emergency Contact: Silvano Rusk, Home Phone: [phone] Mobile Phone: [phone] Relation: Niece Secondary Emergency Contact: Sharpe,Hazel Address: [address]          [place], [place] [address] Johnnette Litter of [place] Phone: [phone] Relation: Sister  Palliative Care was asked to follow patient by consultation request of Dr.Bronstein, Shanon Brow, MD . This is a follow up visit. Present on video telemedicine appt were patient, Staff [name] and myself.  ASSESSMENT and RECOMMENDATIONS:   1.Pain: Appears controlled with current regimen. PT therapy has ended. Arm range of motion poor, candidate for restorative aide work. These exercises had previously been somewhat uncomfortable for pt. If going to have ROM she should have her prn pain relief.  2. Intake and weight: Continue supplements, assistance in feeding. Stable wt. 123 lb., 125 and 125.4 lb.eating 75-100% and shakes.   3. Skin issues: Continue current regimen. No yellow or red areas on hand, which RP had asked about on previous visit.  no break down noted.  4. Mobility: Continue to encourage mobility, using safety measures and fall prevention. Patient uses walker, walks with heavy assist, usually pivots to transfer.  5. Goals of care; DNR limited most with trial abx, iv, no feeding tube, DNH. T/c To [facility], no concerns and no changes in plan of care.   Palliative care  will continue to follow for goals of care clarification and symptom management. Return 6-8 weeks or prn.  I spent 25 minutes providing this consultation,  from 1530 to 1555. More than 50% of the time in this consultation was spent coordinating communication.   HISTORY OF PRESENT ILLNESS:  Krista Phillips is a 85 y.o. year old female with multiple medical problems including adhesive capsulitis of left shoulder s/p Left humerus  Fracture in December 2019, thoracic compression fracture dementia, CHF, BBB, arthritis.. Palliative Care was asked to help address goals of care.   CODE STATUS: DNR limited most with trial abx, iv, no feeding tube, DNH  PPS: 30% HOSPICE ELIGIBILITY/DIAGNOSIS: TBD  PAST MEDICAL HISTORY:  Past Medical History:  Diagnosis Date  . B12 deficiency   . Cardiomyopathy ([place])   . CHF (congestive heart failure) ([place])   . Degenerative arthritis   . Hypertension   . Left bundle branch block (LBBB)     SOCIAL HX:  Social History   Tobacco Use  . Smoking status: Never Smoker  . Smokeless tobacco: Never Used  Substance Use Topics  . Alcohol use: No    ALLERGIES: No Known Allergies   PERTINENT MEDICATIONS:  Outpatient Encounter Medications as of 01/13/2019  Medication Sig  . acetaminophen (TYLENOL) 500 MG tablet Take 500 mg by mouth every 6 (six) hours as needed for mild pain, moderate pain or fever.  . carvedilol (COREG) 3.125 MG tablet Take 3.125 mg by mouth 2 (two) times daily with a meal.  . dextromethorphan-guaiFENesin (ROBITUSSIN-DM) 10-100 MG/5ML liquid Take 5 mLs by mouth every 4 (four) hours as needed for cough.  . diclofenac sodium (VOLTAREN)  1 % GEL Apply 4 g topically 3 (three) times daily as needed (arthritis pain).  . furosemide (LASIX) 40 MG tablet Take 1 tablet (40 mg total) by mouth daily.  Marland Kitchen guaiFENesin (ROBITUSSIN) 100 MG/5ML SOLN Take 5 mLs (100 mg total) by mouth every 4 (four) hours as needed for cough or to loosen phlegm.  . nystatin-triamcinolone  ointment (MYCOLOG) Apply 1 application topically 2 (two) times daily as needed (rash).   . vitamin B-12 (CYANOCOBALAMIN) 1000 MCG tablet Take 1,000 mcg by mouth daily.   No facility-administered encounter medications on file as of 01/13/2019.     PHYSICAL EXAM/ROS with staff [name], patient.  General: NAD, frail appearing, thin, has gained wt, eating 100 % Cardiovascular: no chest pain reported Pulmonary: no cough, SOB Abdomen:appetite good, bms regular GU: incontinent Extremities: no LE edema, left shoulder with reduced ROM Skin: no rashes, no wounds, no breakdown Neurological: Weakness, dementia, good sleep, does not c/o pain, min verbal, FAST score 7 a-b.  Cyndia Skeeters DNP, AGPCNP-BC

## 2019-02-24 ENCOUNTER — Other Ambulatory Visit: Payer: Self-pay

## 2019-02-24 ENCOUNTER — Non-Acute Institutional Stay: Payer: Medicare Other | Admitting: Primary Care

## 2019-02-24 DIAGNOSIS — Z515 Encounter for palliative care: Secondary | ICD-10-CM

## 2019-02-24 NOTE — Progress Notes (Signed)
[facility] Pall[facility] Consult Note Telephone: [phone]  Fax: [phone]  TELEHEALTH VISIT STATEMENT Due to the COVID-19 crisis, this visit was done via telemedicine from my office. It was initiated and consented to by this patient and/or family.  PATIENT NAME: Krista Phillips DOB: [birth year] MRN: [id number]  PRIMARY CARE PROVIDER:   Juluis Phillips, [place]  REFERRING PROVIDER:  Juluis Pitch, MD [address] [place],  [place] [address] [phone]  RESPONSIBLE PARTY:   Extended Emergency Contact Information Primary Emergency Contact: Krista Phillips, Home Phone: [phone] Mobile Phone: [phone] Relation: Niece Secondary Emergency Contact: Krista Phillips Address: [address]          [place], [place] [address] Krista Phillips of [place] Phone: [phone] Relation: Sister  Palliative Care was asked to follow this patient by consultation request of Krista Pitch, MD.  This is a follow up visit. Present on video telemedicine appt were patient, Staff Krista Phillips and myself.  ASSESSMENT AND RECOMMENDATIONS:   1. Goals of Care: Maximize quality of life and symptom management.  2. Symptom Management:   Nutrition: Eats 75-100%, less at breakfast. Appears WNWD.  Pain:  Does not guard left arm, has scheduled tylenol 500 mg bid with prn. PRN dose is given very infrequently.    Mobility: W/c with pivot transfer, no longer walking with assistance. Fall prevention.  Constipation: Continue bowel program per SNF protocol.  3. Family /Caregiver/Community Supports: Husband died on this past 2022-12-19. He had been a patient at [facility] as well. Unsure as to patient comprehension.  4. Cognitive / Functional decline:  Less interactive and less mobile. Eating well, FAST score 7a.  5. Advanced Care Directive: DNR, comfort, limited abx, defined trial IV, no feeding tube. Reviewed 08/2018 and I reviewed with POA after last visit in early May,  2020. DNR uploaded to [facility] today, unable to upload MOST due to technology limitations.   6. Follow up Palliative Care Visit: Palliative care will continue to follow for goals of care clarification and symptom management. Return 6 weeks or prn.  I spent 25 minutes providing this consultation,  from 1400 to 1425. More than 50% of the time in this consultation was spent coordinating communication.   HISTORY OF PRESENT ILLNESS:  Krista Phillips is a 85 y.o. year old female with multiple medical problems including adhesive capsulitis of left shoulder s/p Left humerus Fracture in December 2019, thoracic compression fracturedementia, CHF, BBB, arthritis. Palliative Care was asked to help address goals of care.   CODE STATUS: DNR limited most with trial abx, iv, no feeding tube, DNH.  PPS: 30% HOSPICE ELIGIBILITY/DIAGNOSIS: TBD  PAST MEDICAL HISTORY:  Past Medical History:  Diagnosis Date  . B12 deficiency   . Cardiomyopathy ([place])   . CHF (congestive heart failure) ([place])   . Degenerative arthritis   . Hypertension   . Left bundle branch block (LBBB)     SOCIAL HX:  Social History   Tobacco Use  . Smoking status: Never Smoker  . Smokeless tobacco: Never Used  Substance Use Topics  . Alcohol use: No    ALLERGIES: No Known Allergies   PERTINENT MEDICATIONS:  Outpatient Encounter Medications as of 02/24/2019  Medication Sig  . acetaminophen (TYLENOL) 500 MG tablet Take 500 mg by mouth every 6 (six) hours as needed for mild pain, moderate pain or fever.  . carvedilol (COREG) 3.125 MG tablet Take 3.125 mg by mouth 2 (two) times daily with a meal.  . dextromethorphan-guaiFENesin (ROBITUSSIN-DM) 10-100 MG/5ML  liquid Take 5 mLs by mouth every 4 (four) hours as needed for cough.  . diclofenac sodium (VOLTAREN) 1 % GEL Apply 4 g topically 3 (three) times daily as needed (arthritis pain).  . furosemide (LASIX) 40 MG tablet Take 1 tablet (40 mg total) by mouth daily.  Marland Kitchen guaiFENesin (ROBITUSSIN)  100 MG/5ML SOLN Take 5 mLs (100 mg total) by mouth every 4 (four) hours as needed for cough or to loosen phlegm.  . nystatin-triamcinolone ointment (MYCOLOG) Apply 1 application topically 2 (two) times daily as needed (rash).   . vitamin B-12 (CYANOCOBALAMIN) 1000 MCG tablet Take 1,000 mcg by mouth daily.   No facility-administered encounter medications on file as of 02/24/2019.     PHYSICAL EXAM/ROS:   Current and past weights: Weight 6/12 128.2 lb. And recent reading on  5/22 126 lb. Stable General: NAD, frail appearing, thin Cardiovascular: no chest pain reported, no edema,  Pulmonary: no cough, no increased SOB Abdomen: appetite good, endorses constipation, incontinent of bowel GU: denies dysuria, incontinent of urine. House Shakes tid MSK:  no joint deformities, ambulates in w/c, no longer walking with assistance. Skin: no rashes or wounds reported, palms WNL Neurological: Weakness, dementia Stage 7a, sleep good, denies sundowning or agitation.  Krista Skeeters DNP, AGPCNP-BC

## 2019-03-31 ENCOUNTER — Non-Acute Institutional Stay: Payer: Medicare Other | Admitting: Primary Care

## 2019-03-31 ENCOUNTER — Other Ambulatory Visit: Payer: Self-pay

## 2019-03-31 DIAGNOSIS — Z515 Encounter for palliative care: Secondary | ICD-10-CM

## 2019-03-31 NOTE — Progress Notes (Signed)
[facility] Palliative [facility] Consult Note Telephone: [phone]  Fax: [phone]  TELEHEALTH VISIT STATEMENT Due to the COVID-19 crisis, this visit was done via telemedicine from my office. It was initiated and consented to by this patient and/or family.  PATIENT NAME: Krista Phillips DOB: 09-02-1927 MRN: [id number]  PRIMARY CARE PROVIDER:   Juluis Pitch, [place]  REFERRING PROVIDER:  Juluis Pitch, MD [address] [place],  [place] [address] [phone]  RESPONSIBLE PARTY:   Extended Emergency Contact Information Primary Emergency Contact: Krista Phillips, Home Phone: [phone] Mobile Phone: [phone] Relation: Niece Secondary Emergency Contact: Phillips,Krista Address: [address]          [place], [place] [address] Krista Phillips of [place] Phone: [phone] Relation: Sister  Palliative Care was asked to follow this patient by consultation request of Juluis Pitch, MD. This is a follow up visit.   ASSESSMENT AND RECOMMENDATIONS:   1. Goals of Care: Maximize quality of life and symptom management.  2. Symptom Management:   Behavior changes: Staff states fecal smearing and suspect UTI, Have put call into PCP to assess.  BP: In conversation with niece, she reported bps of 101/70, 128/73. Instructed her that this is a reasonable  Reading given her age and disease process, and lack of any symptoms of hypotension. She voiced understanding.  Nutrition: Recommend nutritional supplements if not getting at present.Losing weight, 7% loss since admission in Dec. 2019.    3. Family /Caregiver/Community Supports: Niece is POA, Krista Phillips. Called and reported visit results today, has questions about bp, other questions RE PCP care which I referred to her [facility] for f/u.  4. Cognitive / Functional decline: Appears at recent  baseline of FAST 7 B and has lost some weight. Does not appear to be significantly changed  in function, which is chair /bed bound.  5. Advanced Care Directive: DNR limited most with trial abx, iv, no feeding tube, DNH  6. Follow up Palliative Care Visit: Palliative care will continue to follow for goals of care clarification and symptom management. Return 6 weeks or prn.  I spent 25  minutes providing this consultation, from 1400 to 1425. More than 50% of the time in this consultation was spent coordinating communication.   HISTORY OF PRESENT ILLNESS:  Krista Phillips is a 85 y.o. year old female with multiple medical problems including adhesive capsulitis of left shoulder s/p Left humerus Fracture in December 2019, thoracic compression fracturedementia, CHF, BBB, arthritis. Palliative Care was asked to help address goals of care.   CODE STATUS: DNR limited most with trial abx, iv, no feeding tube, DNH  PPS: 30% HOSPICE ELIGIBILITY/DIAGNOSIS: TBD Krista Phillips  PAST MEDICAL HISTORY:  Past Medical History:  Diagnosis Date  . B12 deficiency   . Cardiomyopathy ([place])   . CHF (congestive heart failure) ([place])   . Degenerative arthritis   . Hypertension   . Left bundle branch block (LBBB)     SOCIAL HX:  Social History   Tobacco Use  . Smoking status: Never Smoker  . Smokeless tobacco: Never Used  Substance Use Topics  . Alcohol use: No    ALLERGIES: No Known Allergies   PERTINENT MEDICATIONS:  Outpatient Encounter Medications as of 03/31/2019  Medication Sig  . acetaminophen (TYLENOL) 500 MG tablet Take 500 mg by mouth every 6 (six) hours as needed for mild pain, moderate pain or fever.  . carvedilol (COREG) 3.125 MG tablet Take 3.125 mg by mouth 2 (two) times daily  with a meal.  . dextromethorphan-guaiFENesin (ROBITUSSIN-DM) 10-100 MG/5ML liquid Take 5 mLs by mouth every 4 (four) hours as needed for cough.  . diclofenac sodium (VOLTAREN) 1 % GEL Apply 4 g topically 3 (three) times daily as needed (arthritis pain).  . furosemide (LASIX) 40 MG tablet Take 1 tablet (40 mg  total) by mouth daily.  Marland Kitchen guaiFENesin (ROBITUSSIN) 100 MG/5ML SOLN Take 5 mLs (100 mg total) by mouth every 4 (four) hours as needed for cough or to loosen phlegm.  . nystatin-triamcinolone ointment (MYCOLOG) Apply 1 application topically 2 (two) times daily as needed (rash).   . vitamin B-12 (CYANOCOBALAMIN) 1000 MCG tablet Take 1,000 mcg by mouth daily.   No facility-administered encounter medications on file as of 03/31/2019.     PHYSICAL EXAM/ROS:   Current and past weights: 8.3  Lb/ 7% wt loss  In 7 months,125. 2 on 7/20, Weight 6/12 128.2 lb.  01/29/19 = 126 lb. Wt in Dec 2019 = 133.8 lb General: NAD, frail appearing, thin Cardiovascular: no signs chest pain,   Pulmonary: no cough, no increased SOB, room air Abdomen: appetite good, denies constipation, incontinent of bowel, staff states smearing feces and concerned about UTI b/c of behavior change GU: no s/sx dysuria, incontinent of urine MSK:  no joint deformities, oob in chair most of day.  Skin: no rashes or wounds reported Neurological: Weakness, dementia FAST 7b, non verbal  Cyndia Skeeters DNP AGPCNP-BC

## 2019-05-19 ENCOUNTER — Other Ambulatory Visit: Payer: Self-pay

## 2019-05-19 ENCOUNTER — Non-Acute Institutional Stay: Payer: Medicare Other | Admitting: Primary Care

## 2019-05-19 DIAGNOSIS — Z515 Encounter for palliative care: Secondary | ICD-10-CM

## 2019-05-19 NOTE — Progress Notes (Signed)
[facility] Pall[facility] Consult Note Telephone: [phone]  Fax: [phone]  TELEHEALTH VISIT STATEMENT Due to the COVID-19 crisis, this visit was done via telemedicine from my office. It was initiated and consented to by this patient and/or family.  PATIENT NAME: Krista Phillips DOB: [birth year] MRN: [id number]  PRIMARY CARE PROVIDER:   Juluis Pitch, MD, [address] [place] [address] [phone]  REFERRING PROVIDER:  Juluis Pitch, MD [address] [place],  [place] [address] [phone]  RESPONSIBLE PARTY:   Extended Emergency Contact Information Primary Emergency Contact: Krista Phillips, Home Phone: [phone] Mobile Phone: [phone] Relation: Niece Secondary Emergency Contact: Krista Phillips Address: [address]          [place],  [address] Krista Phillips Phone: [phone] Relation: Sister   ASSESSMENT AND RECOMMENDATIONS:   1. Advance Care Planning/Goals of Care: Goals include to maximize quality of life and symptom management. Current directives: DNR limited most with trial abx, iv, no feeding tube, DNH. T/c to [facility] to review goals of care.   2. Symptom Management:    Behavior changes: Last visit she was exhibiting some behavior of fecal play. They were looking into UTI at that time. She did have at UTI , it was treated and fecal smearing stopped. Denies UTI since then.    BP: Follow up from concern by niece last visit. Bps recently have been 117/76 and before 119/75. No symptoms of BP fluctuation. No falls. I reported normal BP to niece today.   Constipation: Denies current problem. Will come up on bowel list if there are issues and we will address if needed.    Nutrition: Had been losing weight but now is gaining. Feeds self.  3. Family /Caregiver/Community Supports: Niece is POA, staff states she  Dropped by some items for patient. POA states window visits are confusing to the patient.  Krista Phillips saw some labs in [facility] and was concerned re some readings. We discussed normal labs for patient's age.   4. Cognitive / Functional decline:  Up in chair and dressed with a hat.  She seems to be stable with function at this point. She feeds self and can pivot transfer into arm chair.  5. Follow up Palliative Care Visit: Palliative care will continue to follow for goals of care clarification and symptom management. Return 6 weeks or prn.  I spent 35 minutes providing this consultation,  from 1045 to 1120. More than 50% of the time in this consultation was spent coordinating communication.   HISTORY OF PRESENT ILLNESS:  Krista Phillips is a 85 y.o. year old female with multiple medical problems including adhesive capsulitis of left shoulder s/p Left humerus Fracture in December 2019, thoracic compression fracturedementia, CHF, BBB, arthritis. Palliative Care was asked to follow this patient by consultation request of Krista Pitch, MD to help address advance care planning and goals of care. This is a follow up visit.  CODE STATUS: DNR limited most with trial abx, iv, no feeding tube, DNH  PPS: 30% HOSPICE ELIGIBILITY/DIAGNOSIS: TBD  PAST MEDICAL HISTORY:  Past Medical History:  Diagnosis Date  . B12 deficiency   . Cardiomyopathy ([place])   . CHF (congestive heart failure) ([place])   . Degenerative arthritis   . Hypertension   . Left bundle branch block (LBBB)     SOCIAL HX:  Social History   Tobacco Use  . Smoking status: Never Smoker  . Smokeless tobacco: Never Used  Substance Use Topics  .  Alcohol use: No    ALLERGIES: No Known Allergies   PERTINENT MEDICATIONS:  Outpatient Encounter Medications as of 05/19/2019  Medication Sig  . acetaminophen (TYLENOL) 500 MG tablet Take 500 mg by mouth every 6 (six) hours as needed for mild pain, moderate pain or fever.  . carvedilol (COREG) 3.125 MG tablet Take 3.125 mg by mouth 2 (two) times daily with a meal.  .  dextromethorphan-guaiFENesin (ROBITUSSIN-DM) 10-100 MG/5ML liquid Take 5 mLs by mouth every 4 (four) hours as needed for cough.  . diclofenac sodium (VOLTAREN) 1 % GEL Apply 4 g topically 3 (three) times daily as needed (arthritis pain).  . furosemide (LASIX) 40 MG tablet Take 1 tablet (40 mg total) by mouth daily.  Marland Kitchen guaiFENesin (ROBITUSSIN) 100 MG/5ML SOLN Take 5 mLs (100 mg total) by mouth every 4 (four) hours as needed for cough or to loosen phlegm.  . nystatin-triamcinolone ointment (MYCOLOG) Apply 1 application topically 2 (two) times daily as needed (rash).   . vitamin B-12 (CYANOCOBALAMIN) 1000 MCG tablet Take 1,000 mcg by mouth daily.   No facility-administered encounter medications on file as of 05/19/2019.     PHYSICAL EXAM / ROS:   Current and past weights: Current weight: 130 lbs on 04/23/2019. 3 lb loss  since 12/19. Historic weights: 7/20= 125. 2, Weight 6/12=128.2 lb.01/29/19 = 126 lb. Wt in Dec 2019 = 133.8 lb General: NAD, frail appearing, thin Cardiovascular: no chest pain reported, no edema reported Pulmonary: no cough, no increased SOB, room air Abdomen: appetite good (76-100%), feeding self,  Denies constipation, incontinent of bowel. GU: denies dysuria, incontinent of urine, no s/sx UTI per staff MSK:  no joint deformities,  Non ambulatory, pivots with assistance into chair Skin: no rashes or wounds reported Neurological: Weakness, dementia FAST 7 b-c., sleeping well.   Krista Skeeters DNP AGPCNP-BC

## 2019-05-28 ENCOUNTER — Telehealth: Payer: Self-pay | Admitting: Primary Care

## 2019-05-28 NOTE — Telephone Encounter (Signed)
Niece called with concerns for fecal smearing (scatolia) and skin break down. We discussed behavior changes being sometimes signs of distress/insult like infection. We discussed possible modifications. I will reach out to SNF staff to assess and see patient next week. Niece is in agreement.

## 2019-06-02 ENCOUNTER — Other Ambulatory Visit: Payer: Self-pay

## 2019-06-02 ENCOUNTER — Non-Acute Institutional Stay: Payer: Medicare Other | Admitting: Primary Care

## 2019-06-14 ENCOUNTER — Other Ambulatory Visit: Payer: Self-pay

## 2019-06-14 ENCOUNTER — Non-Acute Institutional Stay: Payer: Medicare Other | Admitting: Primary Care

## 2019-06-14 DIAGNOSIS — Z515 Encounter for palliative care: Secondary | ICD-10-CM

## 2019-06-14 NOTE — Progress Notes (Signed)
[facility] Palliative Care Consult Note Telephone: [phone]  Fax: [phone]  TELEHEALTH VISIT STATEMENT Due to the COVID-19 crisis, this visit was done via telemedicine from my office. It was initiated and consented to by this patient and/or family.  PATIENT NAME: Krista Phillips [place] [address] [phone] (home)  DOB: 06-14-1927 MRN: [id number]  PRIMARY CARE PROVIDER:   Juluis Pitch, MD, [address] [place] [address] [phone]  REFERRING PROVIDER:  Juluis Pitch, MD [address] [place],  [place] [address] [phone]  RESPONSIBLE PARTY:   Extended Emergency Contact Information Primary Emergency Contact: Silvano Rusk, Home Phone: [phone] Mobile Phone: [phone] Relation: Niece Secondary Emergency Contact: Sharpe,Hazel Address: [address]          [place], [place] [address] Johnnette Litter of [place] Phone: [phone] Relation: Sister   ASSESSMENT AND RECOMMENDATIONS:   1. Advance Care Planning/Goals of Care: Goals include to maximize quality of life and symptom management. I spoke with niece Neoma Laming who states she has less and less interaction, that her aunt will not talk to her. Education given RE expectations of behavior in the patient with advanced dementia. Hospice admission suggested, and niece Neoma Laming states she would like to have her aunt placed on hospice services to augment  care, She had concerns RE her aunt having speech therapy but these goals have been met per SNF therapy dept.  Niece chose [facility] when given a choice, citing several family members who died on that hospice service in the past.  2. Symptom Management:   Nutrition: Weight loss significant in past 2 months. Recommend total oral hand feeding assistance. Recommend magic cup, other supplements. Weight loss is enough to warrant hospice services as it is likely part of dementia progression.  Fecal smearing: Patient has  been smearing feces and getting some on her face. Family is concerned as to the meaning. We discussed possible acute episodes of infection but also progression of dementia behaviors as well.   3. Family /Caregiver/Community Supports: POA is niece, patient never had children. Her husband passed away this year at the SNF. Current resident of SNF.  4. Cognitive / Functional decline: States several word answers e.g I ain't got no pain. Cannot offer answer to "how are you?"  OOB to wheel chair  5. Follow up Palliative Care Visit: Palliative care will continue to follow if not admitted to hospice, for goals of care clarification and symptom management. Return 1-2 weeks if not admitted to hospice, or prn.  I spent 35 minutes providing this consultation,  from 1530 to 1605. More than 50% of the time in this consultation was spent coordinating communication.   HISTORY OF PRESENT ILLNESS:  Lafreda Phillips is a 85 y.o. year old female with multiple medical problems including adhesive capsulitis of left shoulder s/p Left humerus Fracture in December 2019, thoracic compression fracturedementia, CHF, BBB, arthritis. Palliative Care was asked to follow this patient by consultation request of Juluis Pitch, MD to help address advance care planning and goals of care. This is a follow up visit.  CODE STATUS: DNR limited most with trial abx, iv, no feeding tube, DNH  PPS: 30% HOSPICE ELIGIBILITY/DIAGNOSIS: TBD  PAST MEDICAL HISTORY:  Past Medical History:  Diagnosis Date  . B12 deficiency   . Cardiomyopathy ([place])   . CHF (congestive heart failure) ([place])   . Degenerative arthritis   . Hypertension   . Left bundle branch block (LBBB)     SOCIAL HX:  Social History   Tobacco Use  . Smoking status: Never Smoker  . Smokeless tobacco: Never Used  Substance Use Topics  . Alcohol use: No    ALLERGIES: No Known Allergies   PERTINENT MEDICATIONS:  Outpatient Encounter Medications as of 06/14/2019   Medication Sig  . acetaminophen (TYLENOL) 500 MG tablet Take 500 mg by mouth every 6 (six) hours as needed for mild pain, moderate pain or fever.  . carvedilol (COREG) 3.125 MG tablet Take 3.125 mg by mouth 2 (two) times daily with a meal.  . dextromethorphan-guaiFENesin (ROBITUSSIN-DM) 10-100 MG/5ML liquid Take 5 mLs by mouth every 4 (four) hours as needed for cough.  . diclofenac sodium (VOLTAREN) 1 % GEL Apply 4 g topically 3 (three) times daily as needed (arthritis pain).  . furosemide (LASIX) 40 MG tablet Take 1 tablet (40 mg total) by mouth daily.  Marland Kitchen guaiFENesin (ROBITUSSIN) 100 MG/5ML SOLN Take 5 mLs (100 mg total) by mouth every 4 (four) hours as needed for cough or to loosen phlegm.  . nystatin-triamcinolone ointment (MYCOLOG) Apply 1 application topically 2 (two) times daily as needed (rash).   . vitamin B-12 (CYANOCOBALAMIN) 1000 MCG tablet Take 1,000 mcg by mouth daily.   No facility-administered encounter medications on file as of 06/14/2019.     PHYSICAL EXAM / ROS:   Current and past weights: 06/10/19 112 lbs, 05/21/2019 120 lbs., 130 lbs on 04/23/2019. 3 lb loss  since 12/19. Historic weights: 7/20= 125. 2,Weight 6/12=128.2 lb.01/29/19 =126 lb. Wt in Dec2019 =133.8 lb 20 lb weight loss since 12/19. 16 % weight loss in 10 mos, most precipitous over last 2 months.  General: NAD, frail appearing, thin, denies pain HEENT: ST was working with her, denies dysphagia Cardiovascular: no chest pain reported, no edema,  Pulmonary: no cough, no increased SOB Abdomen: appetite 1-25%, offering ice cream at lunch and dinner, denies constipation but has been eating stool, incontinent of bowel GU: denies dysuria, incontinent of urine, had a UTI and also had this behavior when she had UTI before. The behavior improved when previous UTI was resolved. MSK:  no joint deformities, non-ambulatory. W/c bound Skin: excoriation near rectum, still being treated Neurological: Weakness, dementia stage  7a-c, not verbal for the most part. She said she had no pain, but could not offer how she was.  Cyndia Skeeters DNP AGPCNP-BC

## 2019-07-11 DEATH — deceased

## 2020-04-05 IMAGING — CT CT CERVICAL SPINE W/O CM
4 of 8 series · 13 of 33 positions shown, 14 images · non-contrast
Comparison: Head CT 06/28/2018

Addendum:
CLINICAL DATA: Fall in shower. No loss of consciousness. Initial
encounter.

EXAM:
CT HEAD WITHOUT CONTRAST
CT CERVICAL SPINE WITHOUT CONTRAST
TECHNIQUE: Multidetector CT imaging of the head and cervical spine was
performed following the standard protocol without intravenous
contrast. Multiplanar CT image reconstructions of the cervical spine
were also generated.

[Series 2: head bone · axial · 0.42mm/px · z∈[+416,+488]mm · 3 of 74 slices shown]
[im 19/74  bone]
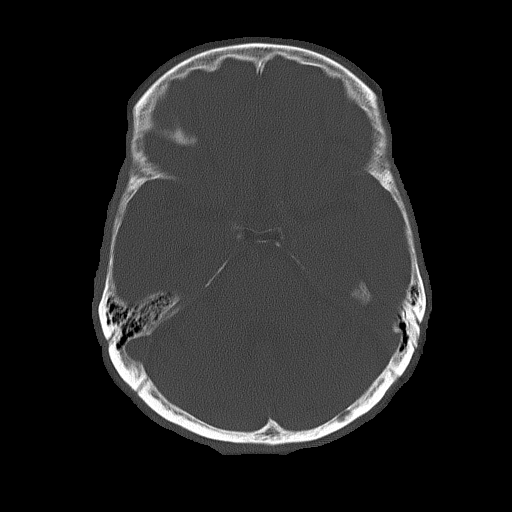
[im 37/74  bone]
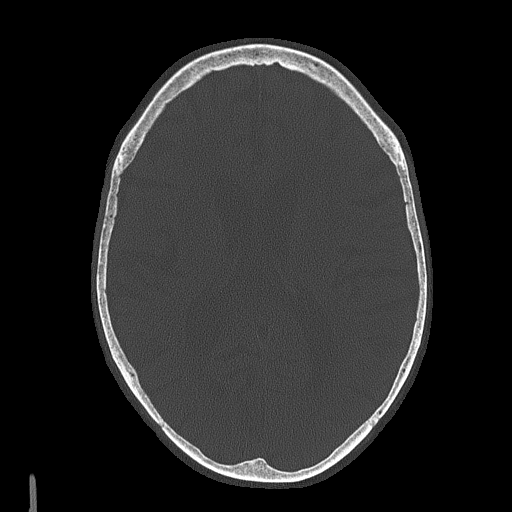
[im 55/74  bone]
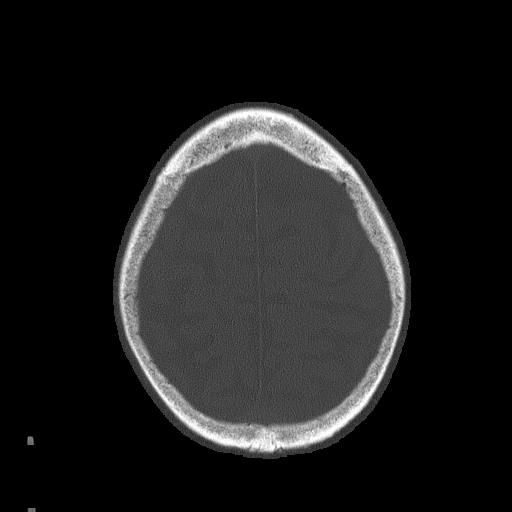

[Series 5: coronal soft tissue · coronal · 0.31mm/px · 3 of 64 slices shown]
[im 13/64  bone]
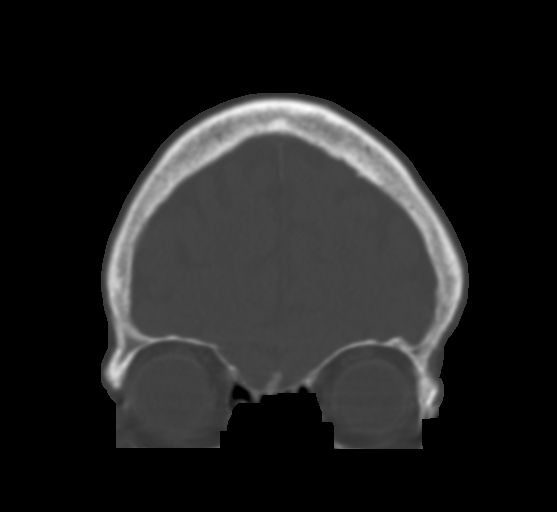
[im 26/64  bone]
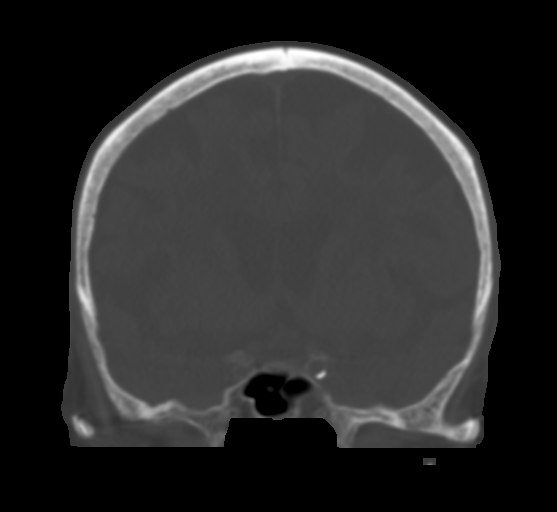
[im 38/64  bone]
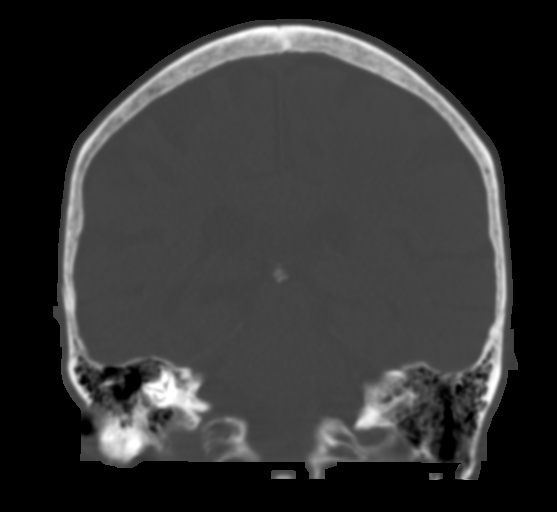

[Series 8: c spine soft · axial · 0.37mm/px · z∈[+280,+360]mm · 3 of 79 slices shown, 4 images]
[im 20/79  soft-tissue]
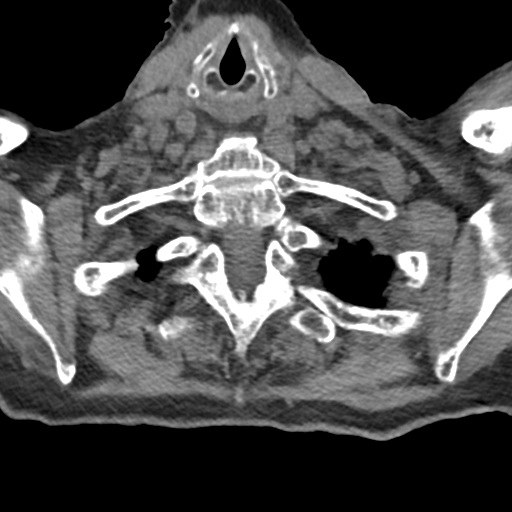
[im 20/79  bone]
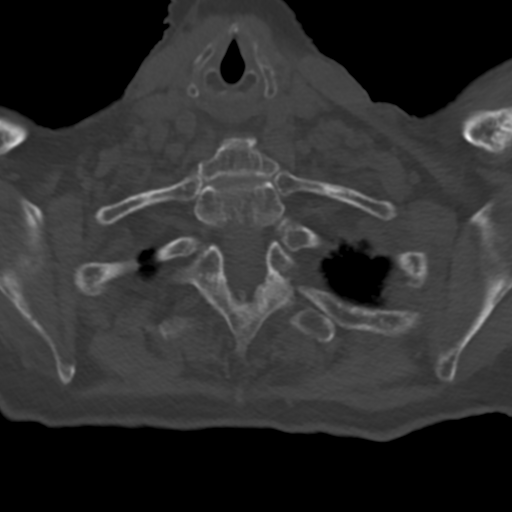
[im 40/79  bone]
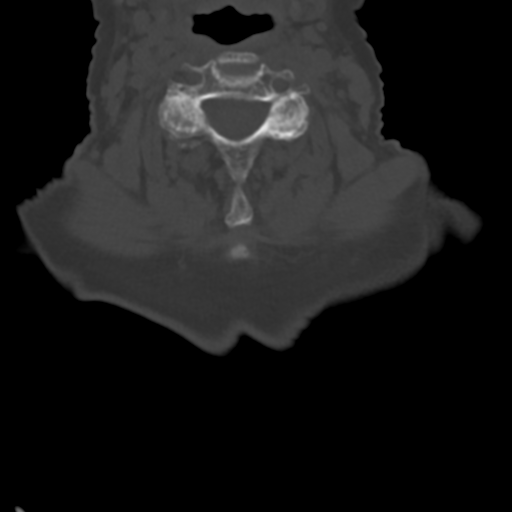
[im 59/79  bone]
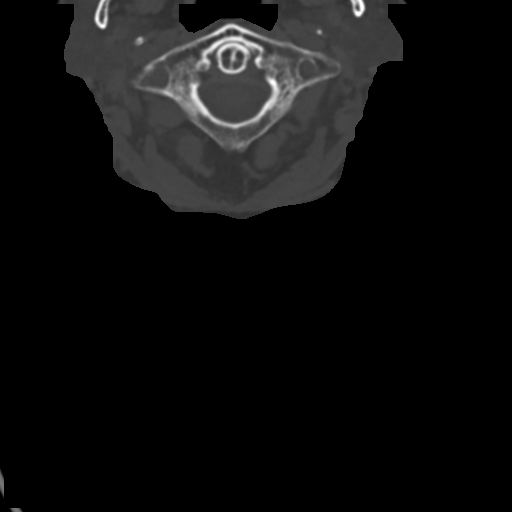

[Series 11: sagittal bone · sagittal · 0.25mm/px · 4 of 50 slices shown]
[im 10/50  bone]
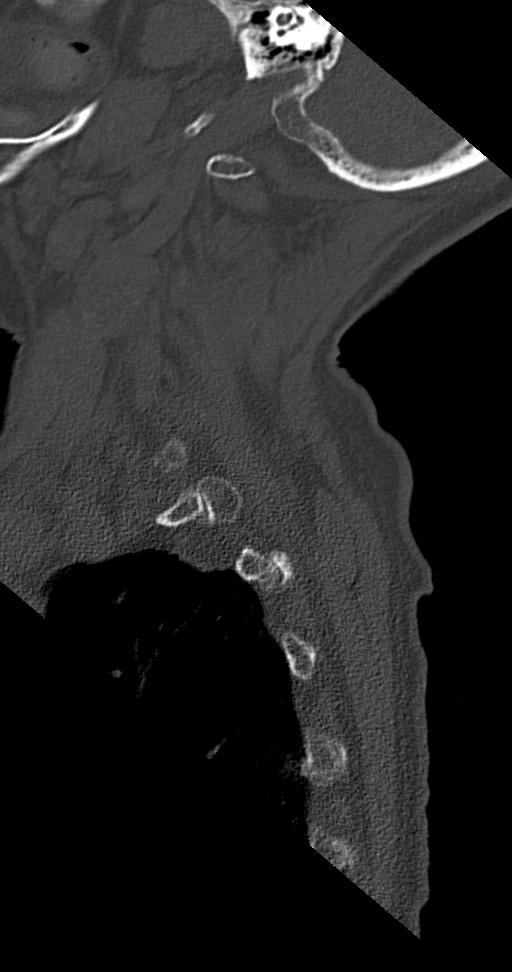
[im 20/50  bone]
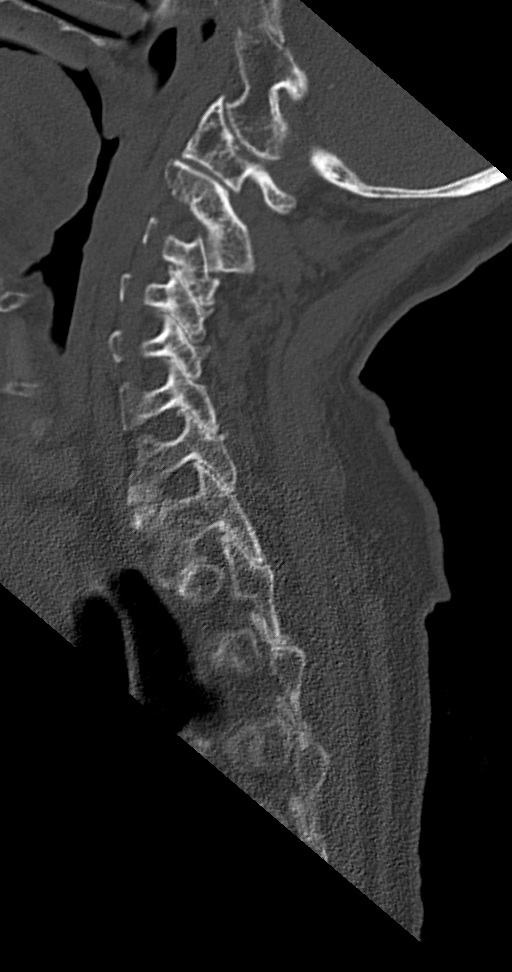
[im 30/50  bone]
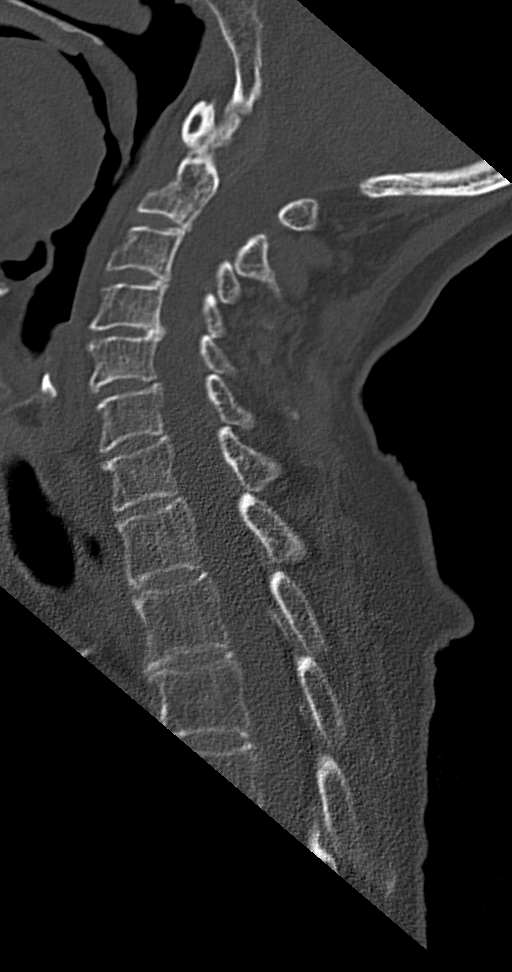
[im 40/50  bone]
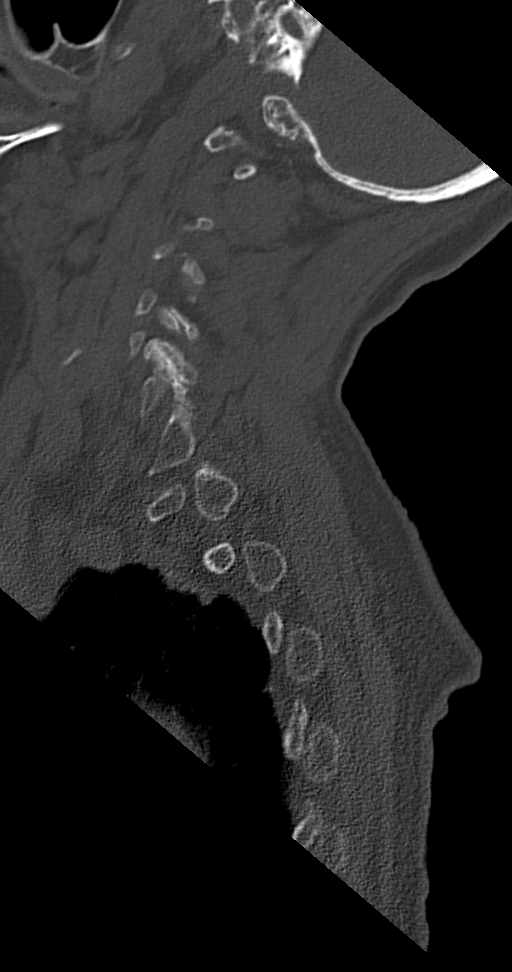

[13 of 33 positions shown; findings below may reference images not displayed]

FINDINGS: CT HEAD FINDINGS

Brain: There is no evidence of acute infarct, intracranial
hemorrhage, mass, midline shift, or extra-axial fluid collection.
Generalized cerebral atrophy is mild for age. Patchy to confluent
cerebral white matter hypodensities are unchanged and nonspecific
but compatible with moderately advanced chronic small vessel
ischemic disease.

Vascular: Calcified atherosclerosis at the skull base. No hyperdense
vessel.

Skull: No fracture or suspicious osseous lesion.

Sinuses/Orbits: Mild right maxillary sinus mucosal thickening. Clear
mastoid air cells. Unremarkable orbits.

Other: None.

CT CERVICAL SPINE FINDINGS

Alignment: Normal.

Skull base and vertebrae: No acute fracture or destructive osseous
process. Mild C1-2 arthropathy.

Soft tissues and spinal canal: No prevertebral fluid or swelling. No
visible canal hematoma.

Disc levels: Moderate multilevel facet arthrosis predominantly in
the mid cervical spine. Mild disc degeneration greatest at C4-5. No
osseous spinal stenosis. Mild neural foraminal stenosis bilaterally
at C4-5.

Upper chest: No apical lung consolidation or mass.

Other: Mild carotid artery atherosclerosis.
IMPRESSION: 1. No evidence of acute intracranial abnormality.
2. Moderate chronic small vessel ischemic disease.
3. No evidence of acute cervical spine fracture or subluxation.

ADDENDUM:
Unchanged 6 mm extra-axial mass over the right frontal convexity
compatible with a meningioma.

*** End of Addendum ***
